# Patient Record
Sex: Male | Born: 1985 | Race: White | Hispanic: No | Smoking: Former smoker
Health system: Southern US, Community
[De-identification: ages and names within clinical notes are randomized; demographics above are authoritative.]

## PROBLEM LIST (undated history)

## (undated) DIAGNOSIS — F329 Major depressive disorder, single episode, unspecified: Secondary | ICD-10-CM

## (undated) DIAGNOSIS — F32A Depression, unspecified: Secondary | ICD-10-CM

## (undated) DIAGNOSIS — E7800 Pure hypercholesterolemia, unspecified: Secondary | ICD-10-CM

## (undated) DIAGNOSIS — L309 Dermatitis, unspecified: Secondary | ICD-10-CM

## (undated) DIAGNOSIS — N209 Urinary calculus, unspecified: Secondary | ICD-10-CM

## (undated) DIAGNOSIS — IMO0001 Reserved for inherently not codable concepts without codable children: Secondary | ICD-10-CM

## (undated) DIAGNOSIS — E785 Hyperlipidemia, unspecified: Secondary | ICD-10-CM

## (undated) DIAGNOSIS — G4700 Insomnia, unspecified: Secondary | ICD-10-CM

## (undated) DIAGNOSIS — J45909 Unspecified asthma, uncomplicated: Secondary | ICD-10-CM

## (undated) DIAGNOSIS — F419 Anxiety disorder, unspecified: Secondary | ICD-10-CM

## (undated) DIAGNOSIS — F640 Transsexualism: Secondary | ICD-10-CM

## (undated) DIAGNOSIS — E349 Endocrine disorder, unspecified: Secondary | ICD-10-CM

## (undated) HISTORY — DX: Anxiety disorder, unspecified: F41.9

## (undated) HISTORY — DX: Endocrine disorder, unspecified: E34.9

## (undated) HISTORY — DX: Urinary calculus, unspecified: N20.9

## (undated) HISTORY — DX: Major depressive disorder, single episode, unspecified: F32.9

## (undated) HISTORY — DX: Insomnia, unspecified: G47.00

## (undated) HISTORY — DX: Reserved for inherently not codable concepts without codable children: IMO0001

## (undated) HISTORY — DX: Hyperlipidemia, unspecified: E78.5

## (undated) HISTORY — DX: Depression, unspecified: F32.A

## (undated) HISTORY — DX: Dermatitis, unspecified: L30.9

## (undated) HISTORY — PX: NO PAST SURGERIES: SHX2092

## (undated) HISTORY — DX: Transsexualism: F64.0

---
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
## 2005-02-13 ENCOUNTER — Emergency Department (HOSPITAL_COMMUNITY): Admission: EM | Admit: 2005-02-13 | Discharge: 2005-02-13 | Payer: Self-pay | Admitting: Emergency Medicine

## 2005-04-17 ENCOUNTER — Ambulatory Visit: Payer: Self-pay | Admitting: Internal Medicine

## 2005-05-01 ENCOUNTER — Ambulatory Visit: Payer: Self-pay | Admitting: Gastroenterology

## 2005-05-04 ENCOUNTER — Ambulatory Visit (HOSPITAL_COMMUNITY): Admission: RE | Admit: 2005-05-04 | Discharge: 2005-05-04 | Payer: Self-pay | Admitting: Gastroenterology

## 2005-05-07 ENCOUNTER — Ambulatory Visit: Payer: Self-pay | Admitting: Internal Medicine

## 2005-05-09 ENCOUNTER — Ambulatory Visit: Payer: Self-pay | Admitting: Internal Medicine

## 2005-06-26 ENCOUNTER — Ambulatory Visit: Payer: Self-pay | Admitting: Internal Medicine

## 2005-10-31 ENCOUNTER — Ambulatory Visit: Payer: Self-pay | Admitting: Internal Medicine

## 2006-07-23 ENCOUNTER — Ambulatory Visit: Payer: Self-pay | Admitting: Internal Medicine

## 2006-11-08 ENCOUNTER — Ambulatory Visit: Payer: Self-pay | Admitting: Family Medicine

## 2007-05-02 DIAGNOSIS — J309 Allergic rhinitis, unspecified: Secondary | ICD-10-CM | POA: Insufficient documentation

## 2007-05-02 DIAGNOSIS — J45909 Unspecified asthma, uncomplicated: Secondary | ICD-10-CM

## 2007-05-06 ENCOUNTER — Ambulatory Visit: Payer: Self-pay | Admitting: Internal Medicine

## 2007-05-06 DIAGNOSIS — F411 Generalized anxiety disorder: Secondary | ICD-10-CM

## 2007-05-06 DIAGNOSIS — G4700 Insomnia, unspecified: Secondary | ICD-10-CM

## 2007-09-15 ENCOUNTER — Encounter (INDEPENDENT_AMBULATORY_CARE_PROVIDER_SITE_OTHER): Payer: Self-pay | Admitting: *Deleted

## 2007-10-16 ENCOUNTER — Telehealth (INDEPENDENT_AMBULATORY_CARE_PROVIDER_SITE_OTHER): Payer: Self-pay | Admitting: *Deleted

## 2007-10-29 ENCOUNTER — Encounter (INDEPENDENT_AMBULATORY_CARE_PROVIDER_SITE_OTHER): Payer: Self-pay | Admitting: *Deleted

## 2007-11-02 ENCOUNTER — Emergency Department (HOSPITAL_COMMUNITY): Admission: EM | Admit: 2007-11-02 | Discharge: 2007-11-02 | Payer: Self-pay | Admitting: Emergency Medicine

## 2007-11-03 ENCOUNTER — Ambulatory Visit: Payer: Self-pay | Admitting: Internal Medicine

## 2007-11-13 HISTORY — PX: INJECTION OF SILICONE OIL: SHX6422

## 2008-03-08 ENCOUNTER — Telehealth (INDEPENDENT_AMBULATORY_CARE_PROVIDER_SITE_OTHER): Payer: Self-pay | Admitting: *Deleted

## 2008-03-30 ENCOUNTER — Ambulatory Visit: Payer: Self-pay | Admitting: Internal Medicine

## 2008-09-14 ENCOUNTER — Telehealth (INDEPENDENT_AMBULATORY_CARE_PROVIDER_SITE_OTHER): Payer: Self-pay | Admitting: *Deleted

## 2008-09-16 ENCOUNTER — Telehealth (INDEPENDENT_AMBULATORY_CARE_PROVIDER_SITE_OTHER): Payer: Self-pay | Admitting: *Deleted

## 2008-12-08 ENCOUNTER — Ambulatory Visit: Payer: Self-pay | Admitting: Internal Medicine

## 2009-07-28 ENCOUNTER — Encounter (INDEPENDENT_AMBULATORY_CARE_PROVIDER_SITE_OTHER): Payer: Self-pay | Admitting: *Deleted

## 2009-08-22 ENCOUNTER — Ambulatory Visit: Payer: Self-pay | Admitting: Internal Medicine

## 2010-02-06 ENCOUNTER — Ambulatory Visit: Payer: Self-pay | Admitting: Internal Medicine

## 2010-02-06 ENCOUNTER — Telehealth (INDEPENDENT_AMBULATORY_CARE_PROVIDER_SITE_OTHER): Payer: Self-pay | Admitting: *Deleted

## 2010-08-23 ENCOUNTER — Telehealth (INDEPENDENT_AMBULATORY_CARE_PROVIDER_SITE_OTHER): Payer: Self-pay | Admitting: *Deleted

## 2010-09-05 ENCOUNTER — Encounter (INDEPENDENT_AMBULATORY_CARE_PROVIDER_SITE_OTHER): Payer: Self-pay | Admitting: *Deleted

## 2010-11-09 ENCOUNTER — Ambulatory Visit: Payer: Self-pay | Admitting: Internal Medicine

## 2010-11-14 ENCOUNTER — Other Ambulatory Visit: Payer: Self-pay | Admitting: Internal Medicine

## 2010-11-14 ENCOUNTER — Encounter: Payer: Self-pay | Admitting: Internal Medicine

## 2010-11-15 LAB — CBC WITH DIFFERENTIAL/PLATELET
Basophils Absolute: 0 10*3/uL (ref 0.0–0.1)
Basophils Relative: 0.4 % (ref 0.0–3.0)
Eosinophils Absolute: 0.2 10*3/uL (ref 0.0–0.7)
Eosinophils Relative: 3.2 % (ref 0.0–5.0)
HCT: 46 % (ref 39.0–52.0)
Hemoglobin: 15.6 g/dL (ref 13.0–17.0)
Lymphocytes Relative: 46.2 % — ABNORMAL HIGH (ref 12.0–46.0)
Lymphs Abs: 2.6 10*3/uL (ref 0.7–4.0)
MCHC: 34 g/dL (ref 30.0–36.0)
MCV: 88.4 fl (ref 78.0–100.0)
Monocytes Absolute: 0.6 10*3/uL (ref 0.1–1.0)
Monocytes Relative: 11.2 % (ref 3.0–12.0)
Neutro Abs: 2.2 10*3/uL (ref 1.4–7.7)
Neutrophils Relative %: 39 % — ABNORMAL LOW (ref 43.0–77.0)
Platelets: 222 10*3/uL (ref 150.0–400.0)
RBC: 5.2 Mil/uL (ref 4.22–5.81)
RDW: 13.8 % (ref 11.5–14.6)
WBC: 5.5 10*3/uL (ref 4.5–10.5)

## 2010-11-15 LAB — LIPID PANEL
Cholesterol: 279 mg/dL — ABNORMAL HIGH (ref 0–200)
HDL: 33.9 mg/dL — ABNORMAL LOW (ref >39.00)
Total CHOL/HDL Ratio: 8
Triglycerides: 281 mg/dL — ABNORMAL HIGH (ref 0.0–149.0)
VLDL: 56.2 mg/dL — ABNORMAL HIGH (ref 0.0–40.0)

## 2010-11-15 LAB — BASIC METABOLIC PANEL
BUN: 11 mg/dL (ref 6–23)
CO2: 27 mEq/L (ref 19–32)
Calcium: 9.7 mg/dL (ref 8.4–10.5)
Chloride: 102 mEq/L (ref 96–112)
Creatinine, Ser: 0.6 mg/dL (ref 0.4–1.5)
GFR: 161.13 mL/min (ref >60.00)
Glucose, Bld: 82 mg/dL (ref 70–99)
Potassium: 4.3 mEq/L (ref 3.5–5.1)
Sodium: 139 mEq/L (ref 135–145)

## 2010-11-15 LAB — HEPATIC FUNCTION PANEL
ALT: 21 U/L (ref 0–53)
AST: 21 U/L (ref 0–37)
Albumin: 4.5 g/dL (ref 3.5–5.2)
Alkaline Phosphatase: 68 U/L (ref 39–117)
Bilirubin, Direct: 0.1 mg/dL (ref 0.0–0.3)
Total Bilirubin: 1.1 mg/dL (ref 0.3–1.2)
Total Protein: 7.9 g/dL (ref 6.0–8.3)

## 2010-11-15 LAB — TSH: TSH: 1.9 u[IU]/mL (ref 0.35–5.50)

## 2010-11-15 LAB — LDL CHOLESTEROL, DIRECT: Direct LDL: 215.8 mg/dL

## 2010-11-16 ENCOUNTER — Telehealth: Payer: Self-pay | Admitting: Internal Medicine

## 2010-11-16 DIAGNOSIS — E785 Hyperlipidemia, unspecified: Secondary | ICD-10-CM | POA: Insufficient documentation

## 2010-11-16 HISTORY — DX: Hyperlipidemia, unspecified: E78.5

## 2010-11-22 ENCOUNTER — Encounter (INDEPENDENT_AMBULATORY_CARE_PROVIDER_SITE_OTHER): Payer: Self-pay | Admitting: *Deleted

## 2010-12-12 NOTE — Progress Notes (Signed)
Summary: appt---lmom  10/12, SENT LTR 10/25  Phone Note Outgoing Call   Call placed by: Army Fossa CMA,  August 23, 2010 10:32 AM Summary of Call: Pt is due for an OV with Dr.Paz.  Follow-up for Phone Call        lmom to call and schedule an OV.Jerolyn Shin  August 23, 2010 12:34 PM  MAILED LETTER.Marland KitchenMarland KitchenJerolyn Shin  September 05, 2010 10:06 AM

## 2010-12-12 NOTE — Progress Notes (Signed)
  Phone Note Call from Patient Call back at [phone]   Caller: mom Victorino Dike Reason for Call: Talk to Nurse Summary of Call: [place] [employer] [place], meds were in purse Zoloft and Xanax.  OV Scheduled Initial call taken by: Kandice Hams,  February 06, 2010 10:51 AM
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
## 2010-12-12 NOTE — Letter (Signed)
Summary: Primary Care Appointment Letter  [facility] [place]  [address] [place], [place] [address]   Phone: [phone]  Fax: [phone]    09/05/2010 MRN: [id number]  [facility] [address] [place], [place]  [address]  Dear Mr. Katrinka Blazing,   Your Primary Care Physician [place] E. Paz MD has indicated that:    __XXXX__it is time to schedule an appointment for an Office Visit with Dr Drue Novel.    _______you missed your appointment on______ and need to call and          reschedule.    _______you need to have lab work done.    _______you need to schedule an appointment discuss lab or test results.    _______you need to call to reschedule your appointment that is                       scheduled on _________.     Please call our office as soon as possible. Our phone number is [phone]. Our office is open 8a-12noon and 1p-5p, Monday through Friday.     Thank you,   Sarah at [facility] [address]   [facility]

## 2010-12-14 NOTE — Progress Notes (Signed)
Summary: lab results   Phone Note Outgoing Call   Summary of Call: advise patient: all labs wnl except for his cholesterol which is quite elevated  plan: nutritionist referal, needs daily exercise! please RTC 4 months fasting ----> we need to f/u on this Ethan E. Paz MD  November 16, 2010 8:47 PM   Follow-up for Phone Call        left message for pt to call back. Army Fossa CMA  November 17, 2010 9:30 AM   Additional Follow-up for Phone Call Additional follow up Details #1::        left message for pt to call back. Army Fossa CMA  November 20, 2010 2:28 PM   New Problems: HYPERLIPIDEMIA (ICD-272.4)   Additional Follow-up for Phone Call Additional follow up Details #2::    left message for pt to call back. Army Fossa CMA  November 21, 2010 9:06 AM  left message on voicemail to call back to office. Lucious Groves CMA  November 21, 2010 3:19 PM   please send a letter Elita Quick E. Paz MD  November 21, 2010 4:48 PM   Additional Follow-up for Phone Call Additional follow up Details #3:: Details for Additional Follow-up Action Taken: will send letter. Army Fossa CMA  November 22, 2010 4:19 PM   New Problems: HYPERLIPIDEMIA (ICD-272.4)

## 2010-12-14 NOTE — Letter (Signed)
Summary: Unable To Reach-Consult Scheduled  [facility] [place]  [address] [place], [place] [address]   Phone: [phone]  Fax: [phone]    11/22/2010 MRN: [id number]    Dear Ethan Harris,   We have been unable to reach you by phone.  Please contact our office with an updated phone number.  Enclosed is a copy of your lab results.     Thank you,   Army Fossa CMA  November 22, 2010 4:19 PM

## 2010-12-14 NOTE — Assessment & Plan Note (Signed)
Summary: CPX/FASTING/KN   Vital Signs:  Patient profile:   25 year old male Height:      65 inches Weight:      172.38 pounds BMI:     28.79 Pulse rate:   84 / minute Pulse rhythm:   regular BP sitting:   118 / 72  (left arm) Cuff size:   large  Vitals Entered By: Army Fossa CMA (November 14, 2010 2:06 PM) CC: CPX, fasting  Comments [facility]  declines Td today    History of Present Illness: CPX  Preventive Screening-Counseling & Management  Alcohol-Tobacco     Alcohol type: socially      Smoking Status: current     Packs/Day: socially   Caffeine-Diet-Exercise     Does Patient Exercise: no  Current Medications (verified): 1)  Zoloft 100 Mg Tabs (Sertraline Hcl) .... 2 By Mouth Once Daily 2)  Trazodone Hcl 50 Mg  Tabs (Trazodone Hcl) .Marland Kitchen.. 1 Tab Once Daily Due Office Visit 3)  Xanax 0.5 Mg  Tabs (Alprazolam) .... 1/2 By Mouth Qid  Allergies (verified): No Known Drug Allergies  Past History:  Past Medical History: INSOMNIA   ANXIETY   ASTHMA  ALLERGIC RHINITIS   Eczema Transgender (male to male)-- has used spironialtone, estrogen, premarin on-off   Past Surgical History: Reviewed history from 03/30/2008 and no changes required. none  Family History: Reviewed history from 11/03/2007 and no changes required. colon ca--no prostate ca--no DM--no MI--no  Social History: lives at his mother home (mon-- Jennifer [place]) has no boyfriend , denies been sex. active at present smoker-- socially  at some point used cocaine ETOH-- socially  diet-- planning to change  and improve   Smoking Status:  current Packs/Day:  socially  Does Patient Exercise:  no  Review of Systems General:  Denies fatigue and fever. CV:  Denies chest pain or discomfort and swelling of feet. Resp:  Denies cough and shortness of breath. GI:  Denies bloody stools, diarrhea, nausea, and vomiting. GU:  Denies dysuria and hematuria. Psych:  symptoms well controlled ( see  med list).  Physical Exam  General:  alert, well-developed, and slightly overweight-appearing.   Neck:  no masses and no thyromegaly.   Lungs:  normal respiratory effort, no intercostal retractions, no accessory muscle use, and normal breath sounds.   Heart:  normal rate, regular rhythm, and no murmur.   Abdomen:  soft, non-tender, no distention, no masses, no guarding, and no rigidity.   Extremities:  no pretibial edema bilaterally  Psych:  Oriented X3, memory intact for recent and remote, normally interactive, good eye contact, not anxious appearing, and not depressed appearing.     Impression & Recommendations:  Problem # 1:  ROUTINE GENERAL MEDICAL EXAM@HEALTH  CARE FACL (ICD-V70.0) td 2002   printed material provided regards diet, STE encouraged daily exercise and safe sex   RTC 1 year  labs   Orders: Venipuncture (16[address]) TLB-Lipid Panel (80061-LIPID) TLB-BMP (Basic Metabolic Panel-BMET) (80048-METABOL) TLB-CBC Platelet - w/Differential (85025-CBCD) TLB-Hepatic/Liver Function Pnl (80076-HEPATIC) TLB-TSH (Thyroid Stimulating Hormone) (84443-TSH) T-HIV Antibody  (Reflex) ([id number]) T-RPR (Syphilis) (91478-29562) Specimen Handling (13086)  Problem # 2:  ANXIETY (ICD-300.00) RF meds , RTC  1year His updated medication list for this problem includes:    Zoloft 100 Mg Tabs (Sertraline hcl) .Marland Kitchen... 2 by mouth once daily    Trazodone Hcl 50 Mg Tabs (Trazodone hcl) .Marland Kitchen... 1 tab once daily due office visit    Xanax 0.5 Mg Tabs (Alprazolam) .Marland Kitchen... 1/2  by mouth qid  Problem # 3:  INSOMNIA (ICD-780.52) RF RTC 1 year  Complete Medication List: 1)  Zoloft 100 Mg Tabs (Sertraline hcl) .... 2 by mouth once daily 2)  Trazodone Hcl 50 Mg Tabs (Trazodone hcl) .Marland Kitchen.. 1 tab once daily due office visit 3)  Xanax 0.5 Mg Tabs (Alprazolam) .... 1/2 by mouth qid  Patient Instructions: 1)  Please schedule a follow-up appointment in 1 year.  Prescriptions: XANAX 0.5 MG  TABS (ALPRAZOLAM)  1/2 by mouth qid  #60 x 3   Entered and Authorized by:   Nolon Rod. Ervie Mccard MD   Signed by:   Nolon Rod. Elivia Robotham MD on 11/14/2010   Method used:   Print then Give to Patient   RxID:   [id number] TRAZODONE HCL 50 MG  TABS (TRAZODONE HCL) 1 tab once daily due office visit  #30 Tablet x 12   Entered and Authorized by:   Nolon Rod. Crescentia Boutwell MD   Signed by:   Nolon Rod. Broly Hatfield MD on 11/14/2010   Method used:   Print then Give to Patient   RxID:   [id number] ZOLOFT 100 MG TABS (SERTRALINE HCL) 2 by mouth once daily  #60 Tablet x 12   Entered and Authorized by:   Nolon Rod. Fermina Mishkin MD   Signed by:   Nolon Rod. Josue Kass MD on 11/14/2010   Method used:   Print then Give to Patient   RxID:   [id number]    Orders Added: 1)  Venipuncture [53664] 2)  TLB-Lipid Panel [80061-LIPID] 3)  TLB-BMP (Basic Metabolic Panel-BMET) [80048-METABOL] 4)  TLB-CBC Platelet - w/Differential [85025-CBCD] 5)  TLB-Hepatic/Liver Function Pnl [80076-HEPATIC] 6)  TLB-TSH (Thyroid Stimulating Hormone) [84443-TSH] 7)  T-HIV Antibody  (Reflex) [[id number]] 8)  T-RPR (Syphilis) [63875-64332] 9)  Specimen Handling [99000] 10)  Est. Patient age 19-39 [10]     Risk Factors:  Tobacco use:  current    Type:  socially  Exercise:  no

## 2011-03-14 ENCOUNTER — Ambulatory Visit (INDEPENDENT_AMBULATORY_CARE_PROVIDER_SITE_OTHER): Payer: Self-pay | Admitting: Internal Medicine

## 2011-03-14 ENCOUNTER — Telehealth: Payer: Self-pay | Admitting: Internal Medicine

## 2011-03-14 ENCOUNTER — Encounter: Payer: Self-pay | Admitting: Internal Medicine

## 2011-03-14 DIAGNOSIS — F411 Generalized anxiety disorder: Secondary | ICD-10-CM

## 2011-03-14 DIAGNOSIS — E785 Hyperlipidemia, unspecified: Secondary | ICD-10-CM

## 2011-03-14 MED ORDER — ALPRAZOLAM 0.5 MG PO TABS: 0.5000 mg | ORAL_TABLET | Freq: Four times a day (QID) | ORAL | Status: DC | PRN

## 2011-03-14 NOTE — Assessment & Plan Note (Signed)
Continue trazodone. Okay to take the last Xanax of the day late at night.

## 2011-03-14 NOTE — Telephone Encounter (Signed)
Misty Stanley arrange a nutritionist referral. May need to ask the EPIC team, I don't know how to do it.

## 2011-03-14 NOTE — Progress Notes (Signed)
  Subjective:    Patient ID: Ethan Harris, male    DOB: Nov 12, 1986, 25 y.o.   MRN: [id number]  HPI Here with his sister and his mother. He is previously well control anxiety is not controlled any more for the last week. Very anxious Having frequent panic attacks Can't sleep well. Nothing has changed, denies any problems at work or at home. He did lose a friend 2 weeks ago.  Past Medical History  Diagnosis Date  . Insomnia   . Anxiety   . Allergic rhinitis   . Eczema   . 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   Social History: lives at his mother home (mon-- Ethan Harris) has no boyfriend , denies been sex. active at present smoker-- socially  at some point used cocaine ETOH-- socially      Review of Systems No fever or chills No disorder gross hematuria No nausea, vomiting, diarrhea Hospital a small amount of weight lately. Is trying to exercise more. Denies any suicidal ideas  or depression Denies taking any over-the-counter medications, herbal remedies, hormones.    Objective:   Physical Exam  Constitutional: He is oriented to person, place, and time. He appears well-developed and well-nourished.  Neck: No thyromegaly present.  Cardiovascular: Normal rate, regular rhythm and normal heart sounds.   No murmur heard. Pulmonary/Chest: Effort normal and breath sounds normal. No respiratory distress. He has no wheezes. He has no rales.  Musculoskeletal: He exhibits no edema.  Neurological: He is alert and oriented to person, place, and time.  Skin: Skin is warm and dry.  Psychiatric: He has a normal mood and affect. His behavior is normal. Thought content normal.          Assessment & Plan:  Today , I spent more than 25 min with the patient, >50% of the time counseling about diet, exercise and also anxiety

## 2011-03-14 NOTE — Assessment & Plan Note (Signed)
His cholesterol was extremely high few months ago, we did discuss his results. I recommend diet and exercise. We will refer him to a nutritionist. Reassess in a few months

## 2011-03-14 NOTE — Assessment & Plan Note (Addendum)
Long history of anxiety, has been well controlled for a while up until a week ago. Not clear why he is more anxious, it may be related to the recent loss of a friend. Several treatment options:Change Zoloft? ,Switch to clonazepam?, See a counselor?,  Increase Xanax? We agreed to increase Xanax from 2 tablets daily to 4 tablets a day. We are hoping the increase in anxiety will be temporarily (?related to the lost of his friend) and we agreed not to change Zoloft because it has worked very well for him for a long time.

## 2011-03-23 ENCOUNTER — Emergency Department (HOSPITAL_COMMUNITY)
Admission: EM | Admit: 2011-03-23 | Discharge: 2011-03-24 | Disposition: A | Discharge status: Home / Self Care | Payer: Self-pay | Attending: Emergency Medicine | Admitting: Emergency Medicine

## 2011-03-23 ENCOUNTER — Emergency Department (HOSPITAL_COMMUNITY): Payer: Self-pay

## 2011-03-23 DIAGNOSIS — R0602 Shortness of breath: Secondary | ICD-10-CM | POA: Insufficient documentation

## 2011-03-23 DIAGNOSIS — R0609 Other forms of dyspnea: Secondary | ICD-10-CM | POA: Insufficient documentation

## 2011-03-23 DIAGNOSIS — F411 Generalized anxiety disorder: Secondary | ICD-10-CM | POA: Insufficient documentation

## 2011-03-23 DIAGNOSIS — F410 Panic disorder [episodic paroxysmal anxiety] without agoraphobia: Secondary | ICD-10-CM | POA: Insufficient documentation

## 2011-03-23 DIAGNOSIS — R079 Chest pain, unspecified: Secondary | ICD-10-CM | POA: Insufficient documentation

## 2011-03-23 DIAGNOSIS — R0989 Other specified symptoms and signs involving the circulatory and respiratory systems: Secondary | ICD-10-CM | POA: Insufficient documentation

## 2011-03-23 LAB — POCT I-STAT, CHEM 8
Chloride: 103 mEq/L (ref 96–112)
Creatinine, Ser: 0.7 mg/dL (ref 0.4–1.5)
Glucose, Bld: 98 mg/dL (ref 70–99)
Sodium: 140 mEq/L (ref 135–145)
TCO2: 25 mmol/L (ref 0–100)

## 2011-03-24 LAB — POCT CARDIAC MARKERS

## 2011-03-24 LAB — D-DIMER, QUANTITATIVE: D-Dimer, Quant: <0.22 ug/mL-FEU (ref 0.00–0.48)

## 2011-04-02 ENCOUNTER — Encounter: Payer: Self-pay | Admitting: Internal Medicine

## 2011-04-02 ENCOUNTER — Ambulatory Visit (INDEPENDENT_AMBULATORY_CARE_PROVIDER_SITE_OTHER): Payer: Self-pay | Admitting: Internal Medicine

## 2011-04-02 DIAGNOSIS — F411 Generalized anxiety disorder: Secondary | ICD-10-CM

## 2011-04-02 NOTE — Progress Notes (Signed)
  Subjective:    Patient ID: Ethan Harris, male    DOB: 23-Jul-1986, 25 y.o.   MRN: [id number]  HPI Here with his sister, since the last time I saw him, he went to the ER with an ill-defined chest pain. Cannot describe if they pain anymore that pointing to the anterior chest. ER records reviewed, EKG/d-dimer /chest x-ray/labs were all normal. Since then he is feeling better  Past Medical History  Diagnosis Date  . Insomnia   . Anxiety   . Allergic rhinitis   . Eczema   . 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  Review of Systems Anxiety is currently well-controlled, the increase in Xanax has helped.     Objective:   Physical Exam Alert, oriented, in no apparent distress. No evidence of anxiety or depression during the interview       Assessment & Plan:  Today , I spent more than 15 min with the patient, >50% of the time counseling, and /or reviewing the chart and labs ordered by other providers

## 2011-04-02 NOTE — Assessment & Plan Note (Signed)
was seen @ the ER with chest pain, workup negative. In general he feels better since we increased the Xanax. Patient is counseled, I recommend  Him to see a counselor. At list of providers in this area gaved to the patient. No change in current therapy, followup in 6 months

## 2011-04-26 ENCOUNTER — Other Ambulatory Visit: Payer: Self-pay | Admitting: Internal Medicine

## 2011-04-26 NOTE — Telephone Encounter (Signed)
90, 1 RF 

## 2011-04-26 NOTE — Telephone Encounter (Signed)
Last refilled 03/14/11 #90

## 2011-06-22 ENCOUNTER — Other Ambulatory Visit: Payer: Self-pay | Admitting: Internal Medicine

## 2011-06-22 NOTE — Telephone Encounter (Signed)
Rx Done . 

## 2011-06-22 NOTE — Telephone Encounter (Signed)
Alprazolam request [last refill 04/26/11 #90x1] Please advise.

## 2011-06-22 NOTE — Telephone Encounter (Signed)
Ok 90, 1 RF 

## 2011-07-05 ENCOUNTER — Ambulatory Visit: Payer: Self-pay | Admitting: *Deleted

## 2011-07-30 ENCOUNTER — Other Ambulatory Visit: Payer: Self-pay | Admitting: Internal Medicine

## 2011-08-01 ENCOUNTER — Other Ambulatory Visit: Payer: Self-pay | Admitting: Internal Medicine

## 2011-08-02 NOTE — Telephone Encounter (Signed)
Ok 90 and 1 RF 

## 2011-08-02 NOTE — Telephone Encounter (Signed)
Faxed script back to [facility] @ [phone].Marland KitchenMarland Kitchen9/20/12@8 :36am/LMB

## 2011-10-02 ENCOUNTER — Ambulatory Visit (INDEPENDENT_AMBULATORY_CARE_PROVIDER_SITE_OTHER): Payer: Self-pay | Admitting: Internal Medicine

## 2011-10-02 ENCOUNTER — Encounter: Payer: Self-pay | Admitting: Internal Medicine

## 2011-10-02 VITALS — BP 120/80 | HR 70 | Temp 98.0°F | Wt 164.2 lb

## 2011-10-02 DIAGNOSIS — F411 Generalized anxiety disorder: Secondary | ICD-10-CM

## 2011-10-02 DIAGNOSIS — E785 Hyperlipidemia, unspecified: Secondary | ICD-10-CM

## 2011-10-02 DIAGNOSIS — G4700 Insomnia, unspecified: Secondary | ICD-10-CM

## 2011-10-02 DIAGNOSIS — J45909 Unspecified asthma, uncomplicated: Secondary | ICD-10-CM

## 2011-10-02 NOTE — Assessment & Plan Note (Signed)
Well controlled 

## 2011-10-02 NOTE — Assessment & Plan Note (Signed)
Declined flu shot

## 2011-10-02 NOTE — Progress Notes (Signed)
  Subjective:    Patient ID: Ethan Harris, male    DOB: 1986/09/22, 25 y.o.   MRN: [id number]  HPI ROV Anxiety well controlled insomnia well controlled, requiring less meds than before  Past Medical History  Diagnosis Date  . Insomnia   . Anxiety   . Allergic rhinitis   . Eczema   . 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 He appears well-developed and well-nourished.  HENT:  Head: Normocephalic and atraumatic.  Cardiovascular: Normal rate, regular rhythm and normal heart sounds.   No murmur heard. Pulmonary/Chest: Effort normal and breath sounds normal. No respiratory distress. He has no wheezes. He has no rales.  Neurological: He is alert and oriented to person, place, and time.  Psychiatric: He has a normal mood and affect. His behavior is normal. Judgment and thought content normal.       Assessment & Plan:

## 2011-10-02 NOTE — Assessment & Plan Note (Signed)
On life style modification, recheck on RTC

## 2011-10-02 NOTE — Assessment & Plan Note (Signed)
Well controlled RF as needed Having some family issues --> counseled

## 2011-10-02 NOTE — Patient Instructions (Signed)
Call for refills when needed

## 2011-10-09 ENCOUNTER — Other Ambulatory Visit: Payer: Self-pay | Admitting: Internal Medicine

## 2011-10-11 ENCOUNTER — Other Ambulatory Visit: Payer: Self-pay

## 2011-10-11 MED ORDER — ALPRAZOLAM 0.5 MG PO TABS: ORAL_TABLET | ORAL | Status: DC

## 2011-10-11 NOTE — Telephone Encounter (Signed)
Sister, Victorino Dike called to request refill of xanax for pt, stating that pt sent her a message via the computer because his phone is off. Rx [facility]

## 2011-10-11 NOTE — Telephone Encounter (Signed)
Last OV 10/02/11. Last filled 08/01/11

## 2012-01-02 ENCOUNTER — Encounter: Payer: Self-pay | Admitting: Internal Medicine

## 2012-01-03 ENCOUNTER — Other Ambulatory Visit: Payer: Self-pay | Admitting: Internal Medicine

## 2012-01-03 NOTE — Telephone Encounter (Signed)
Refill done.  

## 2012-01-04 NOTE — Telephone Encounter (Signed)
Refill done.  

## 2012-01-08 ENCOUNTER — Encounter: Payer: Self-pay | Admitting: Internal Medicine

## 2012-01-08 DIAGNOSIS — Z0289 Encounter for other administrative examinations: Secondary | ICD-10-CM

## 2012-03-06 ENCOUNTER — Other Ambulatory Visit: Payer: Self-pay | Admitting: Internal Medicine

## 2012-03-06 NOTE — Telephone Encounter (Signed)
Ok to refill 

## 2012-03-12 NOTE — Telephone Encounter (Signed)
has just took last day of meds, plese send refill requested 4.25.11 if possible

## 2012-03-12 NOTE — Telephone Encounter (Signed)
Ok to refill 

## 2012-03-12 NOTE — Telephone Encounter (Signed)
90, no RF OV before next RF

## 2012-03-12 NOTE — Telephone Encounter (Signed)
Refill done.  

## 2012-04-09 ENCOUNTER — Ambulatory Visit (INDEPENDENT_AMBULATORY_CARE_PROVIDER_SITE_OTHER): Payer: Self-pay | Admitting: Internal Medicine

## 2012-04-09 ENCOUNTER — Encounter: Payer: Self-pay | Admitting: Internal Medicine

## 2012-04-09 DIAGNOSIS — Z23 Encounter for immunization: Secondary | ICD-10-CM

## 2012-04-09 DIAGNOSIS — Z0000 Encounter for general adult medical examination without abnormal findings: Secondary | ICD-10-CM

## 2012-04-09 MED ORDER — ALPRAZOLAM 0.5 MG PO TABS: 0.5000 mg | ORAL_TABLET | Freq: Four times a day (QID) | ORAL | Status: DC | PRN

## 2012-04-09 MED ORDER — TRAZODONE HCL 50 MG PO TABS: 50.0000 mg | ORAL_TABLET | Freq: Every evening | ORAL | Status: DC | PRN

## 2012-04-09 NOTE — Assessment & Plan Note (Signed)
Td 2002 and today  counseled about exercise, diet, STE,  and safe sex  RTC 1 year labs  Chronic medical problems seem well-controlled, refill meds as needed

## 2012-04-09 NOTE — Progress Notes (Signed)
  Subjective:    Patient ID: Ethan Harris, male    DOB: 1986/04/09, 26 y.o.   MRN: [id number]  HPI CPX  Past Medical History  Diagnosis Date  . Insomnia   . Anxiety   . Allergic rhinitis   . Eczema   . 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 HYPERLIPIDEMIA 11/16/2010   Past Surgical History  Procedure Date  . No past surgeries    History   Social History  . Marital Status: Single    Spouse Name: N/A    Number of Children: N/A  . Years of Education: N/A   Occupational History  . entertainer     Social History Main Topics  . Smoking status: Former Smoker    Quit date: 04/13/2011  . Smokeless tobacco: Never Used  . Alcohol Use: Yes     socially  . Drug Use: No  . Sexually Active: Not on file   Other Topics Concern  . Not on file   Social History Narrative   Lives byhimself, (mom- Ronda Fairly being sex active at present---Diet, exercise: not doing well    Family History  Problem Relation Age of Onset  . Colon cancer Neg Hx   . Prostate cancer Neg Hx   . Diabetes Neg Hx   . Heart attack Neg Hx     Review of Systems No chest pain or shortness of breath No nausea, vomiting, diarrhea or blood in the stools No dysuria gross hematuria. Good compliance with all medications; insomnia, anxiety seem well-controlled. Currently not using any hormones. Denies being sexually active.    Objective:   Physical Exam General -- alert, well-developed, and overweight appearing. No apparent distress.  Neck --no  Thyromegaly  Lungs -- normal respiratory effort, no intercostal retractions, no accessory muscle use, and normal breath sounds.   Heart-- normal rate, regular rhythm, no murmur, and no gallop.   Abdomen--soft, non-tender, no distention, no masses, no HSM, no guarding, and no rigidity.   Extremities-- no pretibial edema bilaterally  Neurologic-- alert & oriented X3 and strength  normal in all extremities. Psych-- Cognition and judgment appear intact. Alert and cooperative with normal attention span and concentration.  not anxious appearing and not depressed appearing.       Assessment & Plan:

## 2012-04-10 LAB — CBC WITH DIFFERENTIAL/PLATELET
Basophils Relative: 0.6 % (ref 0.0–3.0)
Eosinophils Absolute: 0.3 10*3/uL (ref 0.0–0.7)
Eosinophils Relative: 4.7 % (ref 0.0–5.0)
HCT: 45.7 % (ref 39.0–52.0)
Lymphs Abs: 2.4 10*3/uL (ref 0.7–4.0)
MCHC: 33.2 g/dL (ref 30.0–36.0)
MCV: 90 fl (ref 78.0–100.0)
Platelets: 201 10*3/uL (ref 150.0–400.0)
RDW: 14.2 % (ref 11.5–14.6)
WBC: 5.8 10*3/uL (ref 4.5–10.5)

## 2012-04-10 LAB — LIPID PANEL
HDL: 46.4 mg/dL (ref >39.00)
Triglycerides: 214 mg/dL — ABNORMAL HIGH (ref 0.0–149.0)

## 2012-04-10 LAB — AST: AST: 35 U/L (ref 0–37)

## 2012-04-10 LAB — TSH: TSH: 2.06 u[IU]/mL (ref 0.35–5.50)

## 2012-04-14 ENCOUNTER — Encounter: Payer: Self-pay | Admitting: *Deleted

## 2012-05-21 ENCOUNTER — Encounter (HOSPITAL_BASED_OUTPATIENT_CLINIC_OR_DEPARTMENT_OTHER): Payer: Self-pay | Admitting: *Deleted

## 2012-05-21 ENCOUNTER — Emergency Department (HOSPITAL_BASED_OUTPATIENT_CLINIC_OR_DEPARTMENT_OTHER)
Admission: EM | Admit: 2012-05-21 | Discharge: 2012-05-21 | Disposition: A | Discharge status: Home / Self Care | Payer: Self-pay | Attending: Emergency Medicine | Admitting: Emergency Medicine

## 2012-05-21 ENCOUNTER — Emergency Department (HOSPITAL_BASED_OUTPATIENT_CLINIC_OR_DEPARTMENT_OTHER): Payer: Self-pay

## 2012-05-21 DIAGNOSIS — N201 Calculus of ureter: Secondary | ICD-10-CM | POA: Insufficient documentation

## 2012-05-21 DIAGNOSIS — R109 Unspecified abdominal pain: Secondary | ICD-10-CM | POA: Insufficient documentation

## 2012-05-21 DIAGNOSIS — F411 Generalized anxiety disorder: Secondary | ICD-10-CM | POA: Insufficient documentation

## 2012-05-21 MED ORDER — OXYCODONE-ACETAMINOPHEN 5-325 MG PO TABS: 1.0000 | ORAL_TABLET | ORAL | Status: AC | PRN

## 2012-05-21 MED ORDER — HYDROMORPHONE HCL PF 2 MG/ML IJ SOLN
2.0000 mg | Freq: Once | INTRAMUSCULAR | Status: AC
  Administered 2012-05-21: 2 mg via INTRAVENOUS
  Filled 2012-05-21: qty 1

## 2012-05-21 MED ORDER — ONDANSETRON HCL 4 MG/2ML IJ SOLN
4.0000 mg | Freq: Once | INTRAMUSCULAR | Status: AC
  Administered 2012-05-21: 4 mg via INTRAVENOUS
  Filled 2012-05-21: qty 2

## 2012-05-21 MED ORDER — TAMSULOSIN HCL 0.4 MG PO CAPS: 0.4000 mg | ORAL_CAPSULE | Freq: Every day | ORAL | Status: DC

## 2012-05-21 MED ORDER — OXYCODONE-ACETAMINOPHEN 5-325 MG PO TABS: 1.0000 | ORAL_TABLET | ORAL | Status: DC | PRN

## 2012-05-21 MED ORDER — SODIUM CHLORIDE 0.9 % IV SOLN
INTRAVENOUS | Status: DC
  Administered 2012-05-21: 09:00:00 via INTRAVENOUS

## 2012-05-21 MED ORDER — KETOROLAC TROMETHAMINE 30 MG/ML IJ SOLN
30.0000 mg | Freq: Once | INTRAMUSCULAR | Status: AC
  Administered 2012-05-21: 30 mg via INTRAVENOUS
  Filled 2012-05-21: qty 1

## 2012-05-21 NOTE — ED Provider Notes (Signed)
History     CSN: [id number]  Arrival date & time 05/21/12  1478   First MD Initiated Contact with Patient 05/21/12 [phone]      Chief Complaint  Patient presents with  . Abdominal Pain  . Flank Pain    (Consider location/radiation/quality/duration/timing/severity/associated sxs/prior treatment) HPI Comments: Patient is a who started having pain in the left flank and left lower quadrant about not half ago. The a kidney storly severe. He says like when he had a kidney stone several years ago. He called EMS and was brought to [facility] ED for evaluation.  Patient is a 26 y.o. male presenting with flank pain. The history is provided by the patient. No language interpreter was used.  Flank Pain This is a new problem. The current episode started 1 to 2 hours ago. The problem occurs constantly. The problem has not changed since onset.Pertinent negatives include no abdominal pain. Associated symptoms comments: No vomiting, no hematuria.. Nothing aggravates the symptoms. Nothing relieves the symptoms. He has tried nothing for the symptoms.    Past Medical History  Diagnosis Date  . Insomnia   . Anxiety   . Allergic rhinitis   . Eczema   . 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 HYPERLIPIDEMIA 11/16/2010    Past Surgical History  Procedure Date  . No past surgeries     Family History  Problem Relation Age of Onset  . Colon cancer Neg Hx   . Prostate cancer Neg Hx   . Diabetes Neg Hx   . Heart attack Neg Hx     History  Substance Use Topics  . Smoking status: Former Smoker    Quit date: 04/13/2011  . Smokeless tobacco: Never Used  . Alcohol Use: Yes     socially      Review of Systems  Constitutional: Negative.   HENT: Negative.   Eyes: Negative.   Respiratory: Negative.   Cardiovascular: Negative.   Gastrointestinal: Negative for nausea, vomiting, abdominal pain and diarrhea.    Genitourinary: Positive for flank pain. Negative for dysuria, frequency and hematuria.  Musculoskeletal: Negative.   Skin: Negative.   Neurological: Negative.   Psychiatric/Behavioral: The patient is nervous/anxious.     Allergies  Review of patient's allergies indicates no known allergies.  Home Medications   Current Outpatient Rx  Name Route Sig Dispense Refill  . ALPRAZOLAM 0.5 MG PO TABS Oral Take 1 tablet (0.5 mg total) by mouth 4 (four) times daily as needed for sleep. 90 tablet 4  . SERTRALINE HCL 100 MG PO TABS  take 2 tablets by mouth once daily 60 tablet 11  . TRAZODONE HCL 50 MG PO TABS Oral Take 1 tablet (50 mg total) by mouth at bedtime as needed for sleep. 30 tablet 4    BP 143/85  Pulse 80  Temp 98.1 F (36.7 C) (Oral)  Resp 16  SpO2 98%  Physical Exam  Nursing note and vitals reviewed. Constitutional: He is oriented to person, place, and time. He appears well-developed and well-nourished.       In moderate distress with left flank and left lower quadrant abdominal pain.  HENT:  Head: Normocephalic and atraumatic.  Right Ear: External ear normal.  Left Ear: External ear normal.  Mouth/Throat: Oropharynx is clear and moist.  Eyes: Conjunctivae and EOM are normal. Pupils are equal, round, and reactive to light.  Neck: Normal range of motion. Neck  supple.  Cardiovascular: Normal rate, regular rhythm and normal heart sounds.   Pulmonary/Chest: Effort normal and breath sounds normal.  Abdominal: Soft. Bowel sounds are normal.       He localizes pain to the left flank and left lower quadrant. There is no point tenderness and no mass. Bowel sounds are normal.  Musculoskeletal: Normal range of motion. He exhibits no edema and no tenderness.  Neurological: He is alert and oriented to person, place, and time.  Skin: Skin is warm and dry. Pallor: No sensory or motor deficit.  Psychiatric: He has a normal mood and affect. His behavior is normal.    ED Course   Procedures (including critical care time)   Labs Reviewed  URINALYSIS, ROUTINE W REFLEX MICROSCOPIC    8:12 AM Patient was seen and had physical examination. IV fluids, IV medications for pain and nausea were ordered. Urinalysis and CT of the abdomen without contrast were ordered.  8:52 AM CT of abdomen/pelvis shows a 5 mm stone at level of L2 in the left ureter.  Advised Flomax, Percocet.  Pt provided with a strainer to try to catch the stone.  Referred to Dr. Laverle Patter, urologist on call.  1. Left ureteral calculus            Carleene Cooper III, MD 05/21/12 [phone]

## 2012-05-21 NOTE — ED Notes (Signed)
Pt amb to room 1 with ems, smiling in nad. Pt reports sudden onset of llq pain radiating to left flank area. Rates at 8/10. Pt states this is similar to his kidney stone pain of the past.

## 2012-05-25 IMAGING — CR DG CHEST 2V
2 series · 2 of 2 positions shown · non-contrast
Comparison: None

CLINICAL DATA: Chest pain, pressure, shortness of breath, cough,
history of childhood asthma

CHEST - 2 VIEW

[w chest pa]
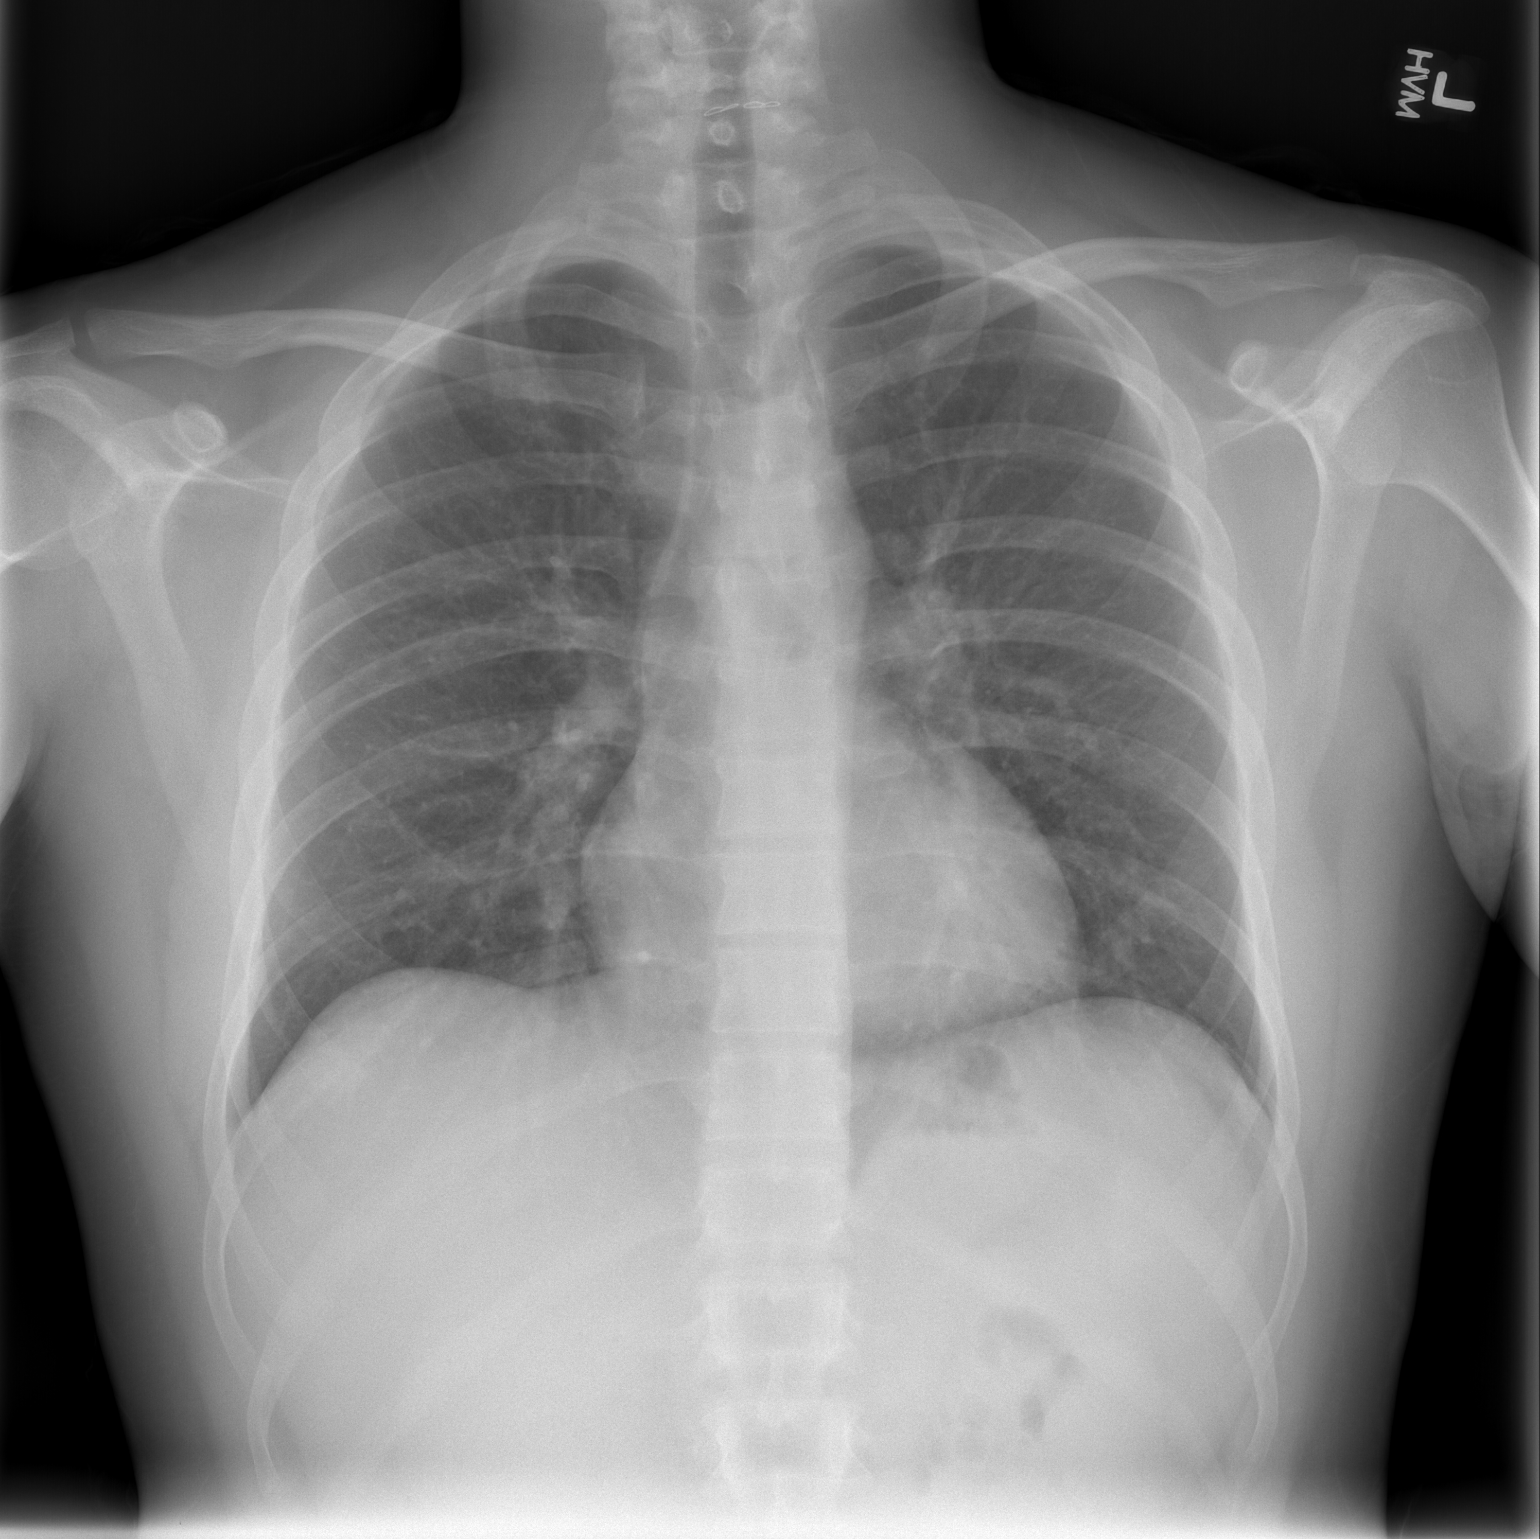

[w chest lat]
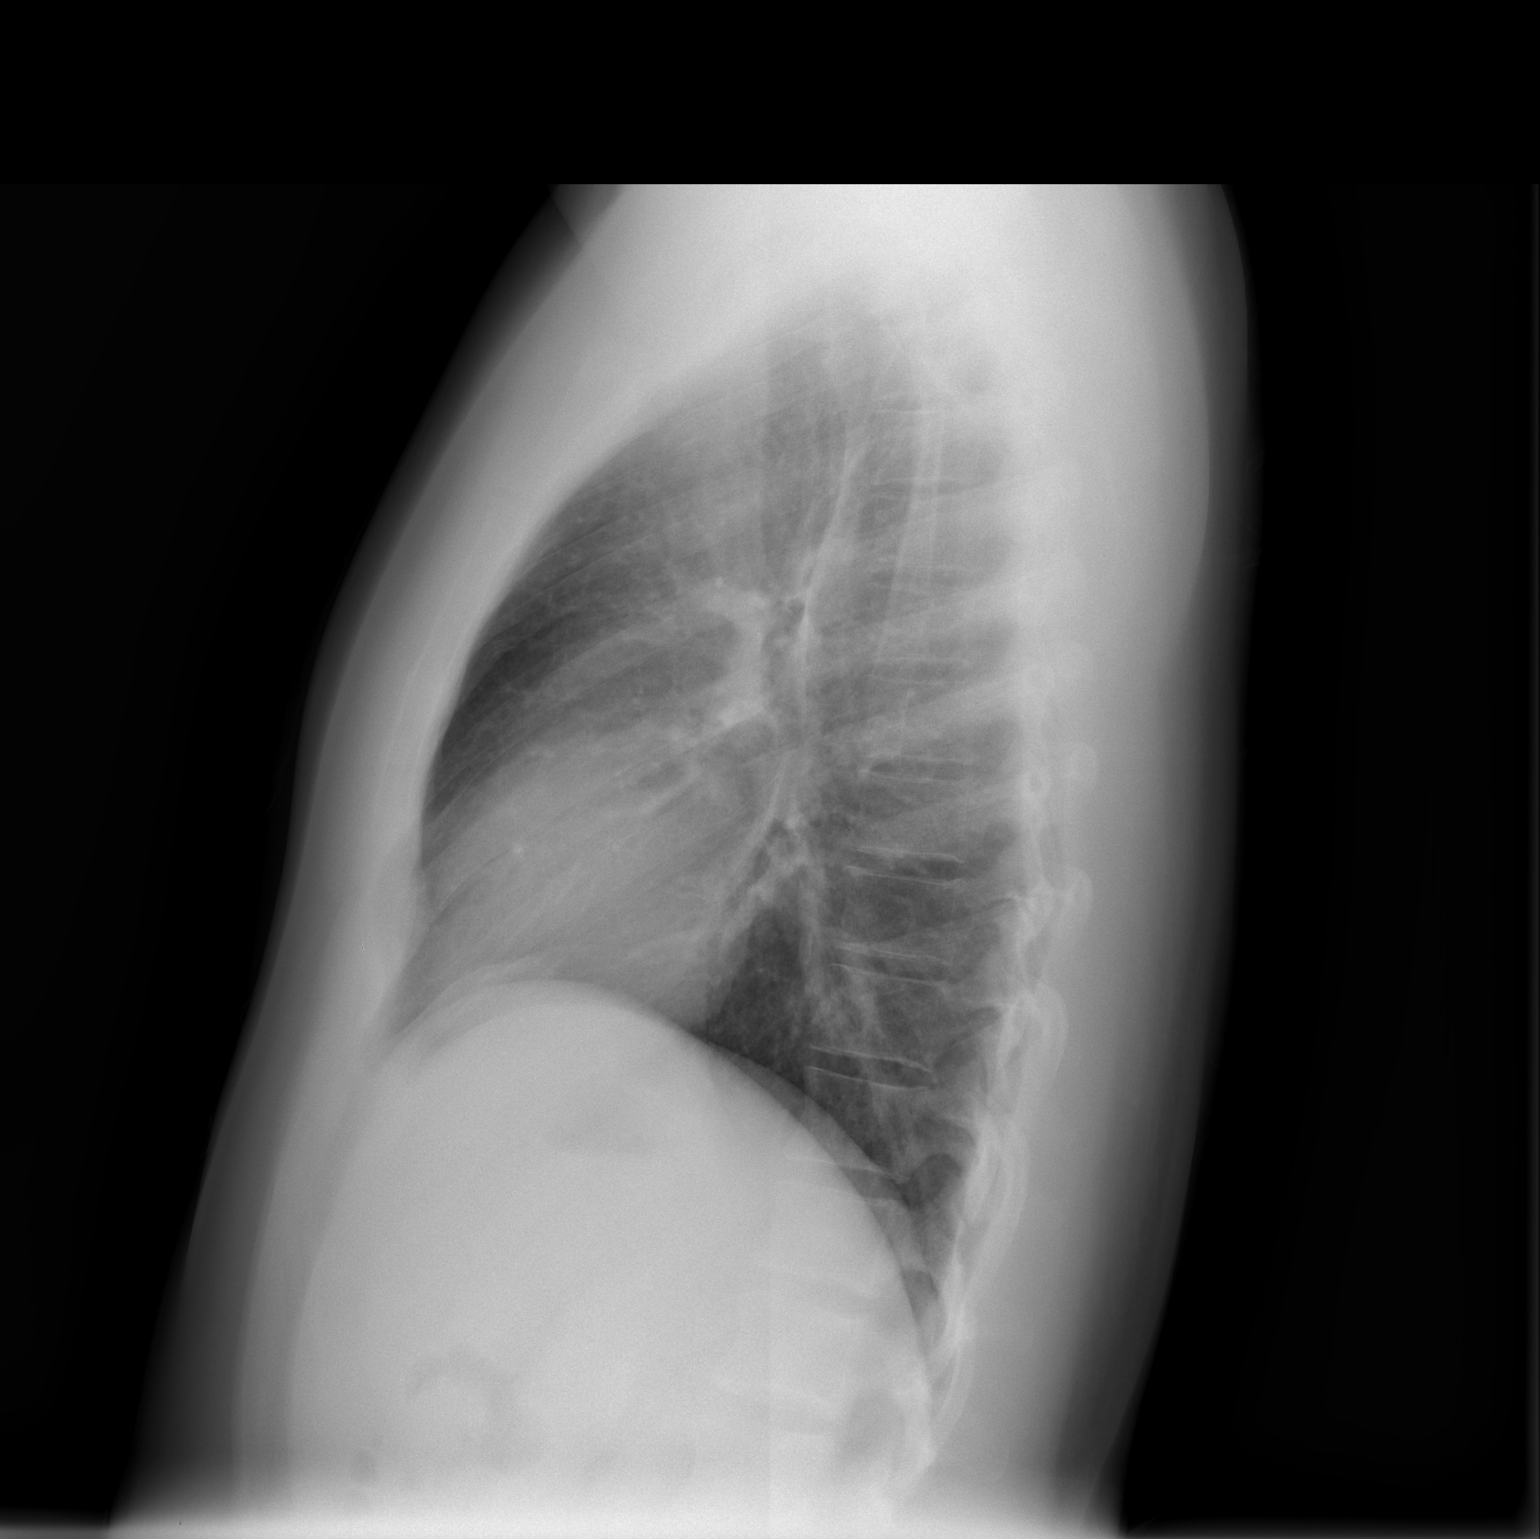

[2 of 2 positions shown; findings below may reference images not displayed]

FINDINGS: Normal heart size, mediastinal contours, and pulmonary vascularity.
Lungs clear.
No pleural effusion or pneumothorax.
Bones unremarkable.
IMPRESSION: No acute abnormalities.

## 2012-07-01 ENCOUNTER — Encounter (HOSPITAL_BASED_OUTPATIENT_CLINIC_OR_DEPARTMENT_OTHER): Payer: Self-pay | Admitting: Emergency Medicine

## 2012-07-01 ENCOUNTER — Emergency Department (HOSPITAL_BASED_OUTPATIENT_CLINIC_OR_DEPARTMENT_OTHER): Payer: Self-pay

## 2012-07-01 ENCOUNTER — Emergency Department (HOSPITAL_BASED_OUTPATIENT_CLINIC_OR_DEPARTMENT_OTHER)
Admission: EM | Admit: 2012-07-01 | Discharge: 2012-07-01 | Disposition: A | Discharge status: Home / Self Care | Payer: Self-pay | Attending: Emergency Medicine | Admitting: Emergency Medicine

## 2012-07-01 DIAGNOSIS — R109 Unspecified abdominal pain: Secondary | ICD-10-CM | POA: Insufficient documentation

## 2012-07-01 DIAGNOSIS — N201 Calculus of ureter: Secondary | ICD-10-CM | POA: Insufficient documentation

## 2012-07-01 DIAGNOSIS — N135 Crossing vessel and stricture of ureter without hydronephrosis: Secondary | ICD-10-CM

## 2012-07-01 LAB — URINALYSIS, ROUTINE W REFLEX MICROSCOPIC
Bilirubin Urine: NEGATIVE
Glucose, UA: NEGATIVE mg/dL
Specific Gravity, Urine: 1.024 (ref 1.005–1.030)

## 2012-07-01 LAB — URINE MICROSCOPIC-ADD ON

## 2012-07-01 MED ORDER — KETOROLAC TROMETHAMINE 15 MG/ML IJ SOLN
15.0000 mg | Freq: Once | INTRAMUSCULAR | Status: AC
  Administered 2012-07-01: 15 mg via INTRAVENOUS
  Filled 2012-07-01: qty 1

## 2012-07-01 MED ORDER — OXYCODONE-ACETAMINOPHEN 5-325 MG PO TABS: 1.0000 | ORAL_TABLET | ORAL | Status: AC | PRN

## 2012-07-01 MED ORDER — SODIUM CHLORIDE 0.9 % IV SOLN
INTRAVENOUS | Status: DC
  Administered 2012-07-01: 07:00:00 via INTRAVENOUS

## 2012-07-01 MED ORDER — CEPHALEXIN 500 MG PO CAPS: 500.0000 mg | ORAL_CAPSULE | Freq: Four times a day (QID) | ORAL | Status: AC

## 2012-07-01 MED ORDER — ONDANSETRON HCL 4 MG/2ML IJ SOLN
4.0000 mg | Freq: Once | INTRAMUSCULAR | Status: AC
  Administered 2012-07-01: 4 mg via INTRAVENOUS
  Filled 2012-07-01: qty 2

## 2012-07-01 MED ORDER — HYDROMORPHONE HCL PF 1 MG/ML IJ SOLN
1.0000 mg | Freq: Once | INTRAMUSCULAR | Status: AC
  Administered 2012-07-01: 1 mg via INTRAVENOUS
  Filled 2012-07-01: qty 1

## 2012-07-01 MED ORDER — ONDANSETRON 8 MG PO TBDP: 8.0000 mg | ORAL_TABLET | Freq: Three times a day (TID) | ORAL | Status: AC | PRN

## 2012-07-01 NOTE — ED Provider Notes (Signed)
Patient care assumed from Dr. Read Drivers.  Patient with pain consistent with kidney stone and CT reveals 6 mm distal left ureteral stone. Patient is currently pain-free. Patient advised of obstruction and need for close followup with urology. Patient is placed on Percocet, Zofran, and Keflex. Patient is referred to a lites urology and advised to call today for followup in the next 3-6 days.    Ct Abdomen Pelvis Wo Contrast  07/01/2012  *RADIOLOGY REPORT*  Clinical Data: Left flank pain radiating to the umbilical region and groin.  Hematuria.  CT ABDOMEN AND PELVIS WITHOUT CONTRAST  Technique:  Multidetector CT imaging of the abdomen and pelvis was performed following the standard protocol without intravenous contrast.  Comparison: 05/21/2012  Findings: The lung bases are clear.  6 mm calculus in the distal left ureter at the ureterovesicle junction.  Proximal pyelocaliectasis and ureterectasis.  Since  previous study, the stone has migrated from the ureteropelvic junction to the present location in the distal left ureter.  No right renal or ureteral stones.  The right renal collecting system is decompressed.  The bladder is decompressed and cannot be evaluated for wall thickness.  The unenhanced appearance of the liver, spleen, gallbladder, pancreas, adrenal glands, abdominal aorta, and retroperitoneal lymph nodes is unremarkable.  The stomach, small bowel, and colon are decompressed.  Small accessory spleen.  No free air or free fluid in the abdomen.  Pelvis:  Appendix is normal.  No diverticulitis.  Prostate gland is not enlarged.  No free or loculated pelvic fluid collections.  IMPRESSION: 6 mm stone in the distal left ureter with proximal ureteral obstruction.   Original Report Authenticated By: Marlon Pel, M.D.     Hilario Quarry, MD 07/01/12 [phone]

## 2012-07-01 NOTE — ED Provider Notes (Addendum)
History     CSN: [id number]  Arrival date & time 07/01/12  7829   None     Chief Complaint  Patient presents with  . Flank Pain    (Consider location/radiation/quality/duration/timing/severity/associated sxs/prior treatment) HPI This is a 26 year old male-to-male transgender patient with history of ureterolithiasis. He is here with several hours of left flank pain radiating to his left groin. The pain is like previous kidney stones. The pain is about a 7/10. He has not taken any medication for this. He has gross hematuria. He denies nausea or vomiting. He is afebrile.  Past Medical History  Diagnosis Date  . Insomnia   . Anxiety   . Allergic rhinitis   . Eczema   . 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 HYPERLIPIDEMIA 11/16/2010    Past Surgical History  Procedure Date  . No past surgeries     Family History  Problem Relation Age of Onset  . Colon cancer Neg Hx   . Prostate cancer Neg Hx   . Diabetes Neg Hx   . Heart attack Neg Hx     History  Substance Use Topics  . Smoking status: Former Smoker    Quit date: 04/13/2011  . Smokeless tobacco: Never Used  . Alcohol Use: Yes     socially      Review of Systems  All other systems reviewed and are negative.    Allergies  Review of patient's allergies indicates no known allergies.  Home Medications   Current Outpatient Rx  Name Route Sig Dispense Refill  . ALPRAZOLAM 0.5 MG PO TABS Oral Take 1 tablet (0.5 mg total) by mouth 4 (four) times daily as needed for sleep. 90 tablet 4  . SERTRALINE HCL 100 MG PO TABS  take 2 tablets by mouth once daily 60 tablet 11  . TAMSULOSIN HCL 0.4 MG PO CAPS Oral Take 1 capsule (0.4 mg total) by mouth daily. 5 capsule 0  . TRAZODONE HCL 50 MG PO TABS Oral Take 1 tablet (50 mg total) by mouth at bedtime as needed for sleep. 30 tablet 4    BP 144/85  Temp 98.1 F (36.7 C) (Oral)  Resp 20  Ht  5\' 5"  (1.651 m)  Wt 170 lb (77.111 kg)  BMI 28.29 kg/m2  SpO2 98%  Physical Exam General: Well-developed, well-nourished male in no acute distress; appearance consistent with age of record HENT: normocephalic, atraumatic Eyes: Normal appearance Neck: supple Heart: regular rate and rhythm Lungs: clear to auscultation bilaterally Abdomen: soft; nondistended; nontender; bowel sounds present GU: no flank tenderness; urine grossly bloody Extremities: No deformity; full range of motion Neurologic: Awake, alert and oriented; motor function intact in all extremities and symmetric; no facial droop Skin: Warm and dry     ED Course  Procedures (including critical care time)    MDM   Results for orders placed during the hospital encounter of 07/01/12  URINALYSIS, ROUTINE W REFLEX MICROSCOPIC      Component Value Range   Color, Urine RED (*) YELLOW   APPearance CLOUDY (*) CLEAR   Specific Gravity, Urine 1.024  1.005 - 1.030   pH 6.0  5.0 - 8.0   Glucose, UA NEGATIVE  NEGATIVE mg/dL   Hgb urine dipstick LARGE (*) NEGATIVE   Bilirubin Urine NEGATIVE  NEGATIVE   Ketones, ur NEGATIVE  NEGATIVE mg/dL   Protein, ur 30 (*) NEGATIVE mg/dL   Urobilinogen, UA 1.0  0.0 - 1.0 mg/dL   Nitrite NEGATIVE  NEGATIVE   Leukocytes, UA SMALL (*) NEGATIVE  URINE MICROSCOPIC-ADD ON      Component Value Range   Squamous Epithelial / LPF RARE  RARE   WBC, UA 0-2  <3 WBC/hpf   RBC / HPF TOO NUMEROUS TO COUNT  <3 RBC/hpf   Bacteria, UA RARE  RARE     Dr. Rosalia Hammers will follow lab CT results and make disposition.         Hanley Seamen, MD 07/01/12 1610  Hanley Seamen, MD 07/01/12 [id number]  Hanley Seamen, MD 07/02/12 [phone]

## 2012-07-01 NOTE — ED Notes (Signed)
Pt awoke with left sided flank pain, that radiates to pelvis and back

## 2012-10-03 ENCOUNTER — Other Ambulatory Visit: Payer: Self-pay | Admitting: Internal Medicine

## 2012-10-03 NOTE — Telephone Encounter (Signed)
Ok to refill 

## 2012-10-03 NOTE — Telephone Encounter (Signed)
done

## 2012-11-26 ENCOUNTER — Emergency Department (HOSPITAL_COMMUNITY)
Admission: EM | Admit: 2012-11-26 | Discharge: 2012-11-26 | Disposition: A | Discharge status: Home / Self Care | Payer: Self-pay | Attending: Emergency Medicine | Admitting: Emergency Medicine

## 2012-11-26 ENCOUNTER — Encounter (HOSPITAL_COMMUNITY): Payer: Self-pay | Admitting: Emergency Medicine

## 2012-11-26 DIAGNOSIS — Z79899 Other long term (current) drug therapy: Secondary | ICD-10-CM | POA: Insufficient documentation

## 2012-11-26 DIAGNOSIS — Y929 Unspecified place or not applicable: Secondary | ICD-10-CM | POA: Insufficient documentation

## 2012-11-26 DIAGNOSIS — T18108A Unspecified foreign body in esophagus causing other injury, initial encounter: Secondary | ICD-10-CM | POA: Insufficient documentation

## 2012-11-26 DIAGNOSIS — E785 Hyperlipidemia, unspecified: Secondary | ICD-10-CM | POA: Insufficient documentation

## 2012-11-26 DIAGNOSIS — F411 Generalized anxiety disorder: Secondary | ICD-10-CM | POA: Insufficient documentation

## 2012-11-26 DIAGNOSIS — IMO0002 Reserved for concepts with insufficient information to code with codable children: Secondary | ICD-10-CM | POA: Insufficient documentation

## 2012-11-26 DIAGNOSIS — T18128A Food in esophagus causing other injury, initial encounter: Secondary | ICD-10-CM

## 2012-11-26 DIAGNOSIS — G4700 Insomnia, unspecified: Secondary | ICD-10-CM | POA: Insufficient documentation

## 2012-11-26 DIAGNOSIS — Y9389 Activity, other specified: Secondary | ICD-10-CM | POA: Insufficient documentation

## 2012-11-26 DIAGNOSIS — Z87891 Personal history of nicotine dependence: Secondary | ICD-10-CM | POA: Insufficient documentation

## 2012-11-26 DIAGNOSIS — Z872 Personal history of diseases of the skin and subcutaneous tissue: Secondary | ICD-10-CM | POA: Insufficient documentation

## 2012-11-26 MED ORDER — GLUCAGON HCL (RDNA) 1 MG IJ SOLR
1.0000 mg | Freq: Once | INTRAMUSCULAR | Status: AC
  Administered 2012-11-26: 1 mg via INTRAVENOUS
  Filled 2012-11-26: qty 1

## 2012-11-26 NOTE — Discharge Instructions (Signed)
Take Omeprazole (Prilosec OTC) once a day. Make sure to chew your food thoroughly before swallowing.  Swallowed Foreign Body, Adult You have swallowed an object (foreign body). Once the foreign body has passed through the food tube (esophagus), which leads from the mouth to the stomach, it will usually continue through the body without problems. This is because the point where the esophagus enters into the stomach is the narrowest place through which the foreign body must pass. Sometimes the foreign body gets stuck. The most common type of foreign body obstruction in adults is food impaction. Many times, bones from fish or meat products may become lodged in the esophagus or injure the throat on the way down. When there is an object that obstructs the esophagus, the most obvious symptoms are pain and the inability to swallow normally. In some cases, foreign bodies that can be life threatening are swallowed. Examples of these are certain medications and illicit drugs. Often in these instances, patients are afraid of telling what they swallowed. However, it is extremely important to tell the emergency caregiver what was swallowed because life-saving treatment may be needed.  X-ray exams may be taken to find the location of the foreign body. However, some objects do not show up well or may be too small to be seen on an X-ray image. If the foreign body is too large or too sharp, it may be too dangerous to allow it to pass on its own. You may need to see a caregiver who specializes in the digestive system (gastroenterologist). In a few cases, a specialist may need to remove the object using a method called "endoscopy". This involves passing a thin, soft, flexible tube into the food pipe to locate and remove the object. Follow up with your primary doctor or the referral you were given by the emergency caregiver. HOME CARE INSTRUCTIONS   If your caregiver says it is safe for you to eat, then only have liquids and  soft foods until your symptoms improve.  Once you are eating normally:  Cut food into small pieces.  Remove small bones from food.  Remove large seeds and pits from fruit.  Chew your food well.  Do not talk, laugh, or engage in physical activity while eating or swallowing. SEEK MEDICAL CARE IF:  You develop worsening shortness of breath, uncontrollable coughing, chest pains or high fever, greater than 102 F (38.9 C).  You are unable to eat or drink or you feel that food is getting stuck in your throat.  You have choking symptoms or cannot stop drooling.  You develop abdominal pain, vomiting (especially of blood), or rectal bleeding. MAKE SURE YOU:   Understand these instructions.  Will watch your condition.  Will get help right away if you are not doing well or get worse. Document Released: 04/18/2010 Document Revised: 01/21/2012 Document Reviewed: 04/18/2010 [facility] Patient Information 2013 [place], [place].

## 2012-11-26 NOTE — ED Notes (Signed)
FYI: PT IDENTIFIES AS A MALE.  Pt states that he has a hx of food getting lodged in his throat.  States he has had to have barium swallows done before.  Pt states that about 30 minutes ago, and states that a piece of chicken did not go down all the way and is "sitting in his esophagus".

## 2012-11-26 NOTE — ED Provider Notes (Signed)
History     CSN: [id number]  Arrival date & time 11/26/12  1547   First MD Initiated Contact with Patient 11/26/12 1613      Chief Complaint  Patient presents with  . "piece of food stuck in esophagus"     (Consider location/radiation/quality/duration/timing/severity/associated sxs/prior treatment) The history is provided by the patient.   27 year old male was eating chicken about 45 minutes ago when a piece got stuck in his esophagus. Since then, has not been able to swallow anything. He tried drinking some water and it came right back up. He denies any pain or difficulty breathing. He had a similar episode about 15 years ago but that he never did have an endoscopy done. Nothing makes this better nothing makes it worse. He does admit to symptoms of GERD with frequent heartburn which he treats with TUMS. He has not been on any proton pump inhibitors or H2 blockers.  Past Medical History  Diagnosis Date  . Insomnia   . Anxiety   . Allergic rhinitis   . Eczema   . 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 HYPERLIPIDEMIA 11/16/2010    Past Surgical History  Procedure Date  . No past surgeries     Family History  Problem Relation Age of Onset  . Colon cancer Neg Hx   . Prostate cancer Neg Hx   . Diabetes Neg Hx   . Heart attack Neg Hx     History  Substance Use Topics  . Smoking status: Former Smoker    Quit date: 04/13/2011  . Smokeless tobacco: Never Used  . Alcohol Use: Yes     Comment: socially      Review of Systems  All other systems reviewed and are negative.    Allergies  Review of patient's allergies indicates no known allergies.  Home Medications   Current Outpatient Rx  Name  Route  Sig  Dispense  Refill  . ALPRAZOLAM 0.5 MG PO TABS   Oral   Take 0.5 mg by mouth 4 (four) times daily as needed. For anxiety.         . SERTRALINE HCL 100 MG PO TABS   Oral   Take 200 mg  by mouth daily.         . TRAZODONE HCL 50 MG PO TABS   Oral   Take 1 tablet (50 mg total) by mouth at bedtime as needed for sleep.   30 tablet   4     BP 152/88  Pulse 78  Temp 98.2 F (36.8 C) (Oral)  Resp 18  SpO2 100%  Physical Exam  Nursing note and vitals reviewed.  27 year old male, resting comfortably and in no acute distress. Vital signs are significant for hypertension with blood pressure 152/88. Oxygen saturation is 100%, which is normal. Head is normocephalic and atraumatic. PERRLA, EOMI. Oropharynx is clear. Neck is nontender and supple without adenopathy or JVD. Back is nontender and there is no CVA tenderness. Lungs are clear without rales, wheezes, or rhonchi. Chest is nontender. Heart has regular rate and rhythm without murmur. Abdomen is soft, flat, nontender without masses or hepatosplenomegaly and peristalsis is normoactive. Extremities have no cyanosis or edema, full range of motion is present. Skin is warm and dry without rash. Neurologic: Mental status is normal, cranial nerves are intact, there are no motor or sensory deficits.  ED Course  Procedures (including critical care time)  1. Food impaction of esophagus       MDM  Esophageal meat impaction. Given his history of GERD, esophageal strictures the most likely cause. However, given that he had an episode when he he was in his early teens, consider anatomic lesion such as esophageal web. He'll be given a dose of glucagon and if that fails, he will need to have an endoscopy. He probably should have elective endoscopy even if that is successful to evaluate for possible GERD and stricture, and therapeutic esophageal dilatation.  There is complete relief of symptoms with IV glucagon. Following this, patient was able to drink water without any difficulty. He is referred to gastroenterology for followup. He is advised to take Prilosec OTC once a day until evaluated by  gastroenterology.        Dione Booze, MD 11/26/12 [phone]

## 2013-01-05 ENCOUNTER — Other Ambulatory Visit: Payer: Self-pay | Admitting: Internal Medicine

## 2013-01-05 NOTE — Telephone Encounter (Signed)
Refill done.  

## 2013-01-30 ENCOUNTER — Telehealth: Payer: Self-pay | Admitting: Internal Medicine

## 2013-01-30 NOTE — Telephone Encounter (Signed)
Done

## 2013-01-30 NOTE — Telephone Encounter (Signed)
Ok to refill? Last OV 5.29.13

## 2013-02-09 DIAGNOSIS — Z862 Personal history of diseases of the blood and blood-forming organs and certain disorders involving the immune mechanism: Secondary | ICD-10-CM | POA: Insufficient documentation

## 2013-02-09 DIAGNOSIS — Z872 Personal history of diseases of the skin and subcutaneous tissue: Secondary | ICD-10-CM | POA: Insufficient documentation

## 2013-02-09 DIAGNOSIS — Z79899 Other long term (current) drug therapy: Secondary | ICD-10-CM | POA: Insufficient documentation

## 2013-02-09 DIAGNOSIS — F3289 Other specified depressive episodes: Secondary | ICD-10-CM | POA: Insufficient documentation

## 2013-02-09 DIAGNOSIS — Z8709 Personal history of other diseases of the respiratory system: Secondary | ICD-10-CM | POA: Insufficient documentation

## 2013-02-09 DIAGNOSIS — F329 Major depressive disorder, single episode, unspecified: Secondary | ICD-10-CM | POA: Insufficient documentation

## 2013-02-09 DIAGNOSIS — R509 Fever, unspecified: Secondary | ICD-10-CM | POA: Insufficient documentation

## 2013-02-09 DIAGNOSIS — Z8639 Personal history of other endocrine, nutritional and metabolic disease: Secondary | ICD-10-CM | POA: Insufficient documentation

## 2013-02-09 DIAGNOSIS — J029 Acute pharyngitis, unspecified: Secondary | ICD-10-CM | POA: Insufficient documentation

## 2013-02-09 DIAGNOSIS — Z87891 Personal history of nicotine dependence: Secondary | ICD-10-CM | POA: Insufficient documentation

## 2013-02-09 DIAGNOSIS — F411 Generalized anxiety disorder: Secondary | ICD-10-CM | POA: Insufficient documentation

## 2013-02-10 ENCOUNTER — Encounter (HOSPITAL_COMMUNITY): Payer: Self-pay | Admitting: Family Medicine

## 2013-02-10 ENCOUNTER — Emergency Department (HOSPITAL_COMMUNITY)
Admission: EM | Admit: 2013-02-10 | Discharge: 2013-02-10 | Disposition: A | Discharge status: Home / Self Care | Payer: Self-pay | Attending: Emergency Medicine | Admitting: Emergency Medicine

## 2013-02-10 DIAGNOSIS — J029 Acute pharyngitis, unspecified: Secondary | ICD-10-CM

## 2013-02-10 HISTORY — DX: Pure hypercholesterolemia, unspecified: E78.00

## 2013-02-10 MED ORDER — IBUPROFEN 200 MG PO TABS
600.0000 mg | ORAL_TABLET | Freq: Once | ORAL | Status: AC
  Administered 2013-02-10: 600 mg via ORAL
  Filled 2013-02-10: qty 3

## 2013-02-10 MED ORDER — IBUPROFEN 600 MG PO TABS: 600.0000 mg | ORAL_TABLET | Freq: Three times a day (TID) | ORAL | Status: DC | PRN

## 2013-02-10 MED ORDER — PENICILLIN V POTASSIUM 500 MG PO TABS: 500.0000 mg | ORAL_TABLET | Freq: Four times a day (QID) | ORAL | Status: AC

## 2013-02-10 MED ORDER — HYDROCODONE-ACETAMINOPHEN 5-325 MG PO TABS
1.0000 | ORAL_TABLET | Freq: Once | ORAL | Status: AC
  Administered 2013-02-10: 1 via ORAL
  Filled 2013-02-10: qty 1

## 2013-02-10 NOTE — ED Notes (Signed)
Patient states that he has had a sore throat, low grade fever and swollen glands over the past few days. Recently had strep throat and did complete antibiotic course. Reports left side throat pain with pain with swallowing.  Erythema noted to throat.

## 2013-02-10 NOTE — ED Notes (Signed)
Dr. Campos at bedside   

## 2013-02-10 NOTE — ED Provider Notes (Signed)
History     CSN: [id number]  Arrival date & time 02/09/13  2318   None     Chief Complaint  Patient presents with  . Sore Throat    (Consider location/radiation/quality/duration/timing/severity/associated sxs/prior treatment) The history is provided by the patient.   the patient reports worsening sore throat over the past one week.  He was on a short course antibiotics but this didn't seem to help.  He now reports he feels like the pain is slightly more on his left side.  He has a lymph node also on his left side.  He has pain with swallowing.  He's continued to be a lead and drink.  He tried over-the-counter pain medications with some improvement in his symptoms.  Symptoms are mild to moderate in severity.  Nothing improves his pain.  He does not remember the name of the antibiotic Past Medical History  Diagnosis Date  . Insomnia   . Anxiety   . Allergic rhinitis   . Eczema   . 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 HYPERLIPIDEMIA 11/16/2010  . Depression   . Hypercholesteremia     Past Surgical History  Procedure Laterality Date  . No past surgeries      Family History  Problem Relation Age of Onset  . Colon cancer Neg Hx   . Prostate cancer Neg Hx   . Diabetes Neg Hx   . Heart attack Neg Hx     History  Substance Use Topics  . Smoking status: Former Smoker    Quit date: 04/13/2011  . Smokeless tobacco: Never Used  . Alcohol Use: Yes     Comment: Socially      Review of Systems  All other systems reviewed and are negative.    Allergies  Review of patient's allergies indicates no known allergies.  Home Medications   Current Outpatient Rx  Name  Route  Sig  Dispense  Refill  . ALPRAZolam (XANAX) 0.5 MG tablet      take 1 tablet by mouth four times a day if needed   90 tablet   2   . sertraline (ZOLOFT) 100 MG tablet   Oral   Take 200 mg by mouth daily.         Marland Kitchen  ibuprofen (ADVIL,MOTRIN) 600 MG tablet   Oral   Take 1 tablet (600 mg total) by mouth every 8 (eight) hours as needed for pain.   15 tablet   0   . penicillin v potassium (VEETID) 500 MG tablet   Oral   Take 1 tablet (500 mg total) by mouth 4 (four) times daily.   28 tablet   0   . traZODone (DESYREL) 50 MG tablet   Oral   Take 1 tablet (50 mg total) by mouth at bedtime as needed for sleep.   30 tablet   4     BP 143/87  Pulse 83  Temp(Src) 98.6 F (37 C) (Oral)  Resp 20  Wt 180 lb 4 oz (81.761 kg)  BMI 30 kg/m2  SpO2 100%  Physical Exam  Nursing note and vitals reviewed. Constitutional: He is oriented to person, place, and time. He appears well-developed and well-nourished.  HENT:  Head: Normocephalic and atraumatic.  Uvula is midline.  Tolerating secretions.  Mild posterior pharyngeal erythema left greater than right.  No peritonsillar abscess.  Normal dentition.  Eyes: EOM are normal.  Neck: Normal range  of motion.  Small cervical lymphadenopathy on the left.   Cardiovascular: Normal rate, regular rhythm, normal heart sounds and intact distal pulses.   Pulmonary/Chest: Effort normal and breath sounds normal. No respiratory distress.  Abdominal: Soft. He exhibits no distension. There is no tenderness.  Musculoskeletal: Normal range of motion.  Neurological: He is alert and oriented to person, place, and time.  Skin: Skin is warm and dry.  Psychiatric: He has a normal mood and affect. Judgment normal.    ED Course  Procedures (including critical care time)  Labs Reviewed - No data to display No results found.   1. Pharyngitis       MDM  Discharge him with treatment for pharyngitis.        Lyanne Co, MD 02/10/13 [phone]

## 2013-02-10 NOTE — ED Notes (Signed)
Patient reports low fever and sore throat past few days. Patient states he completed antibiotics recently but does not feel better.

## 2013-05-29 ENCOUNTER — Other Ambulatory Visit: Payer: Self-pay | Admitting: Internal Medicine

## 2013-05-29 ENCOUNTER — Telehealth: Payer: Self-pay | Admitting: Internal Medicine

## 2013-05-29 NOTE — Telephone Encounter (Signed)
Refill already completed.

## 2013-05-29 NOTE — Telephone Encounter (Signed)
Ok to refill? Last OV 5.29.13, pt does have a future appt scheduled 8.21.14 Last filled 3.21.14

## 2013-05-29 NOTE — Telephone Encounter (Signed)
Refill done #90 no refills per verbal order Dr. Drue Novel and faxed to pharmacy.

## 2013-05-29 NOTE — Telephone Encounter (Signed)
Ok to refill?  Last OV 5.29.13 Last filled 3.21.14 for #90 with 2 RF No controlled substance contract found

## 2013-05-29 NOTE — Telephone Encounter (Signed)
Patient is almost out of his Xanax medication and is calling to request a refill.

## 2013-05-29 NOTE — Telephone Encounter (Signed)
Ok 30 day supply, no further RF w/o OV

## 2013-05-29 NOTE — Telephone Encounter (Addendum)
Has an appointment next months, ok 30 day supply

## 2013-07-01 ENCOUNTER — Encounter: Payer: Self-pay | Admitting: Lab

## 2013-07-02 ENCOUNTER — Encounter: Payer: Self-pay | Admitting: Internal Medicine

## 2013-07-02 DIAGNOSIS — Z0289 Encounter for other administrative examinations: Secondary | ICD-10-CM

## 2013-07-13 ENCOUNTER — Other Ambulatory Visit: Payer: Self-pay | Admitting: Internal Medicine

## 2013-07-14 NOTE — Telephone Encounter (Signed)
DNKA  Needs OV

## 2013-07-14 NOTE — Telephone Encounter (Signed)
Ok to refill? Last OV 5.29.13 Last filled 7.18.14

## 2013-07-20 ENCOUNTER — Emergency Department (HOSPITAL_BASED_OUTPATIENT_CLINIC_OR_DEPARTMENT_OTHER): Payer: Self-pay

## 2013-07-20 ENCOUNTER — Encounter (HOSPITAL_BASED_OUTPATIENT_CLINIC_OR_DEPARTMENT_OTHER): Payer: Self-pay | Admitting: Family Medicine

## 2013-07-20 ENCOUNTER — Emergency Department (HOSPITAL_BASED_OUTPATIENT_CLINIC_OR_DEPARTMENT_OTHER)
Admission: EM | Admit: 2013-07-20 | Discharge: 2013-07-20 | Disposition: A | Discharge status: Home / Self Care | Payer: Self-pay | Attending: Emergency Medicine | Admitting: Emergency Medicine

## 2013-07-20 DIAGNOSIS — Z862 Personal history of diseases of the blood and blood-forming organs and certain disorders involving the immune mechanism: Secondary | ICD-10-CM | POA: Insufficient documentation

## 2013-07-20 DIAGNOSIS — Z79899 Other long term (current) drug therapy: Secondary | ICD-10-CM | POA: Insufficient documentation

## 2013-07-20 DIAGNOSIS — R071 Chest pain on breathing: Secondary | ICD-10-CM | POA: Insufficient documentation

## 2013-07-20 DIAGNOSIS — G4700 Insomnia, unspecified: Secondary | ICD-10-CM | POA: Insufficient documentation

## 2013-07-20 DIAGNOSIS — M549 Dorsalgia, unspecified: Secondary | ICD-10-CM | POA: Insufficient documentation

## 2013-07-20 DIAGNOSIS — Z87891 Personal history of nicotine dependence: Secondary | ICD-10-CM | POA: Insufficient documentation

## 2013-07-20 DIAGNOSIS — F411 Generalized anxiety disorder: Secondary | ICD-10-CM | POA: Insufficient documentation

## 2013-07-20 DIAGNOSIS — Z8639 Personal history of other endocrine, nutritional and metabolic disease: Secondary | ICD-10-CM | POA: Insufficient documentation

## 2013-07-20 DIAGNOSIS — Z872 Personal history of diseases of the skin and subcutaneous tissue: Secondary | ICD-10-CM | POA: Insufficient documentation

## 2013-07-20 DIAGNOSIS — F3289 Other specified depressive episodes: Secondary | ICD-10-CM | POA: Insufficient documentation

## 2013-07-20 DIAGNOSIS — R0789 Other chest pain: Secondary | ICD-10-CM

## 2013-07-20 DIAGNOSIS — F329 Major depressive disorder, single episode, unspecified: Secondary | ICD-10-CM | POA: Insufficient documentation

## 2013-07-20 LAB — TROPONIN I: Troponin I: <0.3 ng/mL (ref <0.30)

## 2013-07-20 MED ORDER — IBUPROFEN 800 MG PO TABS: 800.0000 mg | ORAL_TABLET | Freq: Three times a day (TID) | ORAL | Status: DC | PRN

## 2013-07-20 MED ORDER — IBUPROFEN 800 MG PO TABS
800.0000 mg | ORAL_TABLET | Freq: Once | ORAL | Status: AC
  Administered 2013-07-20: 800 mg via ORAL
  Filled 2013-07-20: qty 1

## 2013-07-20 NOTE — ED Provider Notes (Signed)
  Medical screening examination/treatment/procedure(s) were performed by non-physician practitioner and as supervising physician I was immediately available for consultation/collaboration.    Jolynda Townley, MD 07/20/13 2319 

## 2013-07-20 NOTE — ED Provider Notes (Signed)
CSN: [id number]     Arrival date & time 07/20/13  1349 History   First MD Initiated Contact with Patient 07/20/13 1431     Chief Complaint  Patient presents with  . Chest Pain   (Consider location/radiation/quality/duration/timing/severity/associated sxs/prior Treatment) HPI Comments: Otherwise healthy 27 year old male who presents with left anterior chest pain - worse with movement and palpation of the chest.  States began to notice the pain radiating to his left shoulder and some into his back and he became concerned with this.  He reports that his last cholesterol was 240 and that he and his PCP were trying diet and exercize.  He denies shortness of breath, numbness tingling, diaphoresis, cough, congtestion, pain with deep inspiration, calf or leg pain, exogenous estrogen use currently, surgery or cancer.  Patient is a 27 y.o. male presenting with chest pain. The history is provided by the patient and a relative. No language interpreter was used.  Chest Pain Pain location:  L chest Pain quality: aching, dull and radiating   Pain radiates to:  L shoulder Pain radiates to the back: yes   Pain severity:  Moderate Onset quality:  Gradual Duration:  2 days Timing:  Intermittent Progression:  Waxing and waning Chronicity:  New Context: movement   Context: not breathing, no drug use, not eating, no intercourse, not lifting, not raising an arm, not at rest, no stress and no trauma   Relieved by:  Rest Worsened by:  Movement Ineffective treatments:  None tried Associated symptoms: back pain   Associated symptoms: no abdominal pain, no AICD problem, no altered mental status, no anorexia, no anxiety, no claudication, no cough, no diaphoresis, no dizziness, no dysphagia, no fatigue, no fever, no headache, no heartburn, no lower extremity edema, no nausea, no near-syncope, no numbness, no orthopnea, no palpitations, no PND, no shortness of breath, no syncope, not vomiting and no weakness   Risk  factors: high cholesterol, male sex and smoking   Risk factors: no coronary artery disease, no diabetes mellitus, no Ehlers-Danlos syndrome, no hypertension, no prior DVT/PE and no surgery     Past Medical History  Diagnosis Date  . Insomnia   . Anxiety   . Allergic rhinitis   . Eczema   . 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 HYPERLIPIDEMIA 11/16/2010  . Depression   . Hypercholesteremia    Past Surgical History  Procedure Laterality Date  . No past surgeries     Family History  Problem Relation Age of Onset  . Colon cancer Neg Hx   . Prostate cancer Neg Hx   . Diabetes Neg Hx   . Heart attack Neg Hx    History  Substance Use Topics  . Smoking status: Former Smoker    Quit date: 04/13/2011  . Smokeless tobacco: Never Used  . Alcohol Use: Yes     Comment: Socially    Review of Systems  Constitutional: Negative for fever, diaphoresis and fatigue.  HENT: Negative for trouble swallowing.   Respiratory: Negative for cough and shortness of breath.   Cardiovascular: Positive for chest pain. Negative for palpitations, orthopnea, claudication, syncope, PND and near-syncope.  Gastrointestinal: Negative for heartburn, nausea, vomiting, abdominal pain and anorexia.  Musculoskeletal: Positive for back pain.  Neurological: Negative for dizziness, weakness, numbness and headaches.  All other systems reviewed and are negative.    Allergies  Review of patient's allergies indicates no known allergies.  Home Medications   Current Outpatient Rx  Name  Route  Sig  Dispense  Refill  . ALPRAZolam (XANAX) 0.5 MG tablet      take 1 tablet by mouth four times a day if needed   90 tablet   0     Advise pt. No further refills without office visit ...   . ibuprofen (ADVIL,MOTRIN) 600 MG tablet   Oral   Take 1 tablet (600 mg total) by mouth every 8 (eight) hours as needed for pain.   15 tablet   0   .  ibuprofen (ADVIL,MOTRIN) 800 MG tablet   Oral   Take 1 tablet (800 mg total) by mouth every 8 (eight) hours as needed for pain.   30 tablet   0   . sertraline (ZOLOFT) 100 MG tablet   Oral   Take 200 mg by mouth daily.         . traZODone (DESYREL) 50 MG tablet   Oral   Take 1 tablet (50 mg total) by mouth at bedtime as needed for sleep.   30 tablet   4    BP 105/64  Pulse 64  Temp(Src) 97.9 F (36.6 C) (Oral)  Resp 16  Ht 5\' 5"  (1.651 m)  Wt 180 lb (81.647 kg)  BMI 29.95 kg/m2  SpO2 100% Physical Exam  Nursing note and vitals reviewed. Constitutional: He is oriented to person, place, and time. He appears well-developed and well-nourished. No distress.  HENT:  Head: Normocephalic and atraumatic.  Right Ear: External ear normal.  Left Ear: External ear normal.  Nose: Nose normal.  Mouth/Throat: Oropharynx is clear and moist. No oropharyngeal exudate.  Eyes: Conjunctivae are normal. No scleral icterus.  Neck: Normal range of motion. Neck supple.  Cardiovascular: Normal rate, regular rhythm, normal heart sounds and intact distal pulses.  Exam reveals no gallop and no friction rub.   No murmur heard. Pulmonary/Chest: Effort normal and breath sounds normal. No respiratory distress. He has no wheezes. He has no rales. He exhibits tenderness.    Abdominal: Soft. Bowel sounds are normal. He exhibits no distension. There is no tenderness.  Musculoskeletal: Normal range of motion. He exhibits no edema and no tenderness.  Lymphadenopathy:    He has no cervical adenopathy.  Neurological: He is alert and oriented to person, place, and time. No cranial nerve deficit. He exhibits normal muscle tone. Coordination normal.  Skin: Skin is warm and dry. No rash noted. No erythema. No pallor.  Psychiatric: He has a normal mood and affect. His behavior is normal. Judgment and thought content normal.    ED Course  Procedures (including critical care time) Labs Review Labs Reviewed    TROPONIN I   Imaging Review Dg Chest 2 View  07/20/2013   CLINICAL DATA:  Left upper chest pain. Dizziness.  EXAM: CHEST  2 VIEW  COMPARISON:  03/23/2011  FINDINGS: The heart size and mediastinal contours are within normal limits. Both lungs are clear. The visualized skeletal structures are unremarkable.  IMPRESSION: No active cardiopulmonary disease.   Electronically Signed   By: Charlett Nose M.D.   On: 07/20/2013 15:32   Results for orders placed during the hospital encounter of 07/20/13  TROPONIN I      Result Value Range   Troponin I <0.30  <0.30 ng/mL   Dg Chest 2 View  07/20/2013   CLINICAL DATA:  Left upper chest pain. Dizziness.  EXAM: CHEST  2 VIEW  COMPARISON:  03/23/2011  FINDINGS:  The heart size and mediastinal contours are within normal limits. Both lungs are clear. The visualized skeletal structures are unremarkable.  IMPRESSION: No active cardiopulmonary disease.   Electronically Signed   By: Charlett Nose M.D.   On: 07/20/2013 15:32     Date: 07/20/2013  Rate: 62  Rhythm: normal sinus rhythm with sinus arrythmia  QRS Axis: normal  Intervals: normal  ST/T Wave abnormalities: normal  Conduction Disutrbances: none  Narrative Interpretation:   Old EKG Reviewed: on 23 Mar 2011 No significant changes noted    MDM   1. Chest wall pain    Patient here with likely chest wall pain, though I considered the possibility of CAD, PE, CAP, HIV related infections - patient has normal EKG and initial troponin normal - I do not feel the need to repeat the troponin as the pain started 2 days ago.  Though, in the past he has been on exogenous estrogen, he is no longer on this and has low risk factors for PE at this time.  The pain is not pleuritic in nature and reproducible on exam.  He reports his last HIV test (about 3 months ago) was negative and I do not suspect and AIDS related cause to this.     Izola Price Marisue Humble, PA-C 07/20/13 1711

## 2013-07-20 NOTE — ED Notes (Signed)
Pt c/o left upper chest pain radiating to left shoulder and back x 2 days. Pt reports pain is dull and constant. Pt sts he has had pain before but usually goes away on own.

## 2013-07-22 ENCOUNTER — Encounter: Payer: Self-pay | Admitting: Internal Medicine

## 2013-07-22 ENCOUNTER — Ambulatory Visit (INDEPENDENT_AMBULATORY_CARE_PROVIDER_SITE_OTHER): Payer: Self-pay | Admitting: Internal Medicine

## 2013-07-22 VITALS — BP 119/73 | HR 92 | Temp 98.4°F | Ht 66.4 in | Wt 176.0 lb

## 2013-07-22 DIAGNOSIS — N209 Urinary calculus, unspecified: Secondary | ICD-10-CM

## 2013-07-22 DIAGNOSIS — F411 Generalized anxiety disorder: Secondary | ICD-10-CM

## 2013-07-22 DIAGNOSIS — Z0000 Encounter for general adult medical examination without abnormal findings: Secondary | ICD-10-CM

## 2013-07-22 HISTORY — DX: Urinary calculus, unspecified: N20.9

## 2013-07-22 LAB — CBC WITH DIFFERENTIAL/PLATELET
Basophils Absolute: 0 10*3/uL (ref 0.0–0.1)
HCT: 45 % (ref 39.0–52.0)
Hemoglobin: 15.3 g/dL (ref 13.0–17.0)
Lymphs Abs: 2.9 10*3/uL (ref 0.7–4.0)
MCHC: 34 g/dL (ref 30.0–36.0)
MCV: 87.2 fl (ref 78.0–100.0)
Monocytes Absolute: 0.6 10*3/uL (ref 0.1–1.0)
Monocytes Relative: 8.8 % (ref 3.0–12.0)
Neutro Abs: 3.5 10*3/uL (ref 1.4–7.7)
RDW: 13.6 % (ref 11.5–14.6)

## 2013-07-22 LAB — COMPREHENSIVE METABOLIC PANEL
ALT: 32 U/L (ref 0–53)
AST: 22 U/L (ref 0–37)
Alkaline Phosphatase: 52 U/L (ref 39–117)
Creatinine, Ser: 0.7 mg/dL (ref 0.4–1.5)
GFR: 137.84 mL/min (ref >60.00)
Sodium: 138 mEq/L (ref 135–145)
Total Bilirubin: 0.9 mg/dL (ref 0.3–1.2)
Total Protein: 7.7 g/dL (ref 6.0–8.3)

## 2013-07-22 LAB — LDL CHOLESTEROL, DIRECT: Direct LDL: 156.1 mg/dL

## 2013-07-22 LAB — LIPID PANEL: VLDL: 58.2 mg/dL — ABNORMAL HIGH (ref 0.0–40.0)

## 2013-07-22 MED ORDER — ALPRAZOLAM 0.5 MG PO TABS: 0.5000 mg | ORAL_TABLET | Freq: Four times a day (QID) | ORAL | Status: DC | PRN

## 2013-07-22 NOTE — Assessment & Plan Note (Signed)
Currently well controlled on Zoloft, uses Xanax approximately 4 times a day. Uses trazodone as needed. Plan: Controlled substance contract, UDS RF prn Must RTC q 6 months

## 2013-07-22 NOTE — Assessment & Plan Note (Addendum)
Td 2013  counseled about exercise, diet, STE., safe sex Pt is transgender, reports is nt sexually active, not taking any HRT. RTC 6 months  labs  , offered HIV

## 2013-07-22 NOTE — Patient Instructions (Signed)
Get your blood work before you leave  Next visit in 6 moths or a routine office visit Please make an appointment before you leave the office today (or call few weeks in advance) -----  Safe Sex Safe sex is about reducing the risk of giving or getting a sexually transmitted disease (STD). STDs are spread through sexual contact involving the genitals, mouth, or rectum. Some STDS can be cured and others cannot. Safe sex can also prevent unintended pregnancies.  SAFE SEX PRACTICES  Limit your sexual activity to only one partner who is only having sex with you.  Talk to your partner about their past partners, past STDs, and drug use.  Use a condom every time you have sexual intercourse. This includes vaginal, oral, and anal sexual activity. Both females and males should wear condoms during oral sex. Only use latex or polyurethane condoms and water-based lubricants. Petroleum-based lubricants or oils used to lubricate a condom will weaken the condom and increase the chance that it will break. The condom should be in place from the beginning to the end of sexual activity. Wearing a condom reduces, but does not completely eliminate, your risk of getting or giving a STD. STDs can be spread by contact with skin of surrounding areas.  Get vaccinated for hepatitis B and HPV.  Avoid alcohol and recreational drugs which can affect your judgement. You may forget to use a condom or participate in high-risk sex.  For females, avoid douching after sexual intercourse. Douching can spread an infection farther into the reproductive tract.  Check your body for signs of sores, blisters, rashes, or unusual discharge. See your caregiver if you notice any of these signs.  Avoid sexual contact if you have symptoms of an infection or are being treated for an STD. If you or your partner has herpes, avoid sexual contact when blisters are present. Use condoms at all other times.  See your caregiver for regular screenings,  examinations, and tests for STDs. Before having sex with a new partner, each of you should be screened for STDs and talk about the results with your partner. BENEFITS OF SAFE SEX   There is less of a chance of getting or giving an STD.  You can prevent unwanted or unintended pregnancies.  By discussing safer sex concerns with your partner, you may increase feelings of intimacy, comfort, trust, and honesty between the both of you. Document Released: 12/06/2004 Document Revised: 07/23/2012 Document Reviewed: 04/21/2012 [facility] Patient Information 2014 [place], [place].     Testicular Problems and Self-Exam Men can examine themselves easily and effectively with positive results. Monthly exams detect problems early and save lives. There are numerous causes of swelling in the testicle. Testicular cancer usually appears as a firm painless lump in the front part of the testicle. This may feel like a dull ache or heavy feeling located in the lower abdomen (belly), groin, or scrotum.  The risk is greater in men with undescended testicles and it is more common in young men. It is responsible for almost a fifth of cancers in males between ages 79 and 74. Other common causes of swellings, lumps, and testicular pain include injuries, inflammation (soreness) from infection, hydrocele, and torsion. These are a few of the reasons to do monthly self-examination of the testicles. The exam only takes minutes and could add years to your life. Get in the habit! SELF-EXAMINATION OF THE TESTICLES The testicles are easiest to examine after warm baths or showers and are more difficult to examine  when you are cold. This is because the muscles attached to the testicles retract and pull them up higher or into the abdomen. While standing, roll one testicle between the thumb and forefinger. Feel for lumps, swelling, or discomfort. A normal testicle is egg shaped and feels firm. It is smooth and not tender. The spermatic cord  can be felt as a firm spaghetti-like cord at the back of the testicle. It is also important to examine your groins. This is the crease between the front of your leg and your abdomen. Also, feel for enlarged lymph nodes (glands). Enlarged nodes are also a cause for you to see your caregiver for evaluation.  Self-examination of the testicles and groin areas on a regular basis will help you to know what your own testicles and groins feel like. This will help you pick up an abnormality (difference) at an earlier stage. Early discovery is the key to curing this cancer or treating other conditions. Any lump, change, or swelling in the testicle calls for immediate evaluation by your caregiver. Cancer of the testicle does not result in impotence and it does not prevent normal intercourse or prevent having children. If your caregiver feels that medical treatment or chemotherapy could lead to infertility, sperm can be frozen for future use. It is necessary to see a caregiver as soon as possible after the discovery of a lump in a testicle. Document Released: 02/04/2001 Document Revised: 01/21/2012 Document Reviewed: 10/30/2008 [facility] Patient Information 2014 [place], [place].

## 2013-07-22 NOTE — Progress Notes (Signed)
  Subjective:    Patient ID: Ethan Harris, male    DOB: 1986/04/09, 27 y.o.   MRN: [id number]  HPI CPX  Past Medical History  Diagnosis Date  . Insomnia   . Anxiety and depression   . Allergic rhinitis   . Eczema   . 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 HYPERLIPIDEMIA 11/16/2010  . Hypercholesteremia   . Urolithiasis 07/22/2013   Past Surgical History  Procedure Laterality Date  . No past surgeries     History   Social History  . Marital Status: Single    Spouse Name: N/A    Number of Children: 0  . Years of Education: N/A   Occupational History  . entertainer     Social History Main Topics  . Smoking status: Former Smoker    Quit date: 04/13/2011  . Smokeless tobacco: Never Used  . Alcohol Use: Yes     Comment: Socially  . Drug Use: No  . Sexual Activity: Yes   Other Topics Concern  . Not on file   Social History Narrative   Lives w/ room mates (mom- Jennifer [place])    Denies being sex active at present     Family History  Problem Relation Age of Onset  . Colon cancer Neg Hx   . Prostate cancer Neg Hx   . Diabetes Neg Hx   . Heart attack Neg Hx     Review of Systems Went to the ER with chest pain, chest x-ray, EKG and troponins were negative. Chest pain resolved. One-year history of occasional burning feeling in the plantar area, bilaterally. Usually after a long day of standing or walking. Denies fever or chills No cough, shortness of breath.  No nausea, vomiting, diarrhea or blood in the stools. No dysuria gross hematuria.     Objective:   Physical Exam  BP 119/73  Pulse 92  Temp(Src) 98.4 F (36.9 C)  Ht 5' 6.4" (1.687 m)  Wt 176 lb (79.833 kg)  BMI 28.05 kg/m2  SpO2 98% General -- alert, well-developed, NAD.  Neck --no thyromegaly Lungs -- normal respiratory effort, no intercostal retractions, no accessory muscle use, and normal breath sounds.  Heart-- normal  rate, regular rhythm, no murmur.  Abdomen-- Not distended, good bowel sounds,soft, non-tender. No rebound or rigidity.  Extremities-- no pretibial edema bilaterally , normal pedal pulses and capillary refill throughout Neurologic-- alert & oriented X3. Speech, gait normal. Strength normal in all extremities.  Psych-- Cognition and judgment appear intact. Alert and cooperative with normal attention span and concentration. not anxious appearing and not depressed appearing.      Assessment & Plan:

## 2013-07-23 LAB — HIV ANTIBODY (ROUTINE TESTING W REFLEX): HIV: NONREACTIVE

## 2013-07-28 ENCOUNTER — Telehealth: Payer: Self-pay | Admitting: *Deleted

## 2013-07-28 ENCOUNTER — Encounter: Payer: Self-pay | Admitting: *Deleted

## 2013-07-28 NOTE — Telephone Encounter (Signed)
thx

## 2013-07-28 NOTE — Telephone Encounter (Signed)
Pt notified of lab results and to eat healthy diet and exercise regularly to get cholesterol down. Pt scheduled for 01/25/14 lab visit. Results sent. DJR

## 2013-07-28 NOTE — Telephone Encounter (Signed)
Message copied by Eustace Quail on Tue Jul 28, 2013  1:46 PM ------      Message from: Willow Ora E      Created: Tue Jul 28, 2013 12:31 PM       Call the patient:      All  results are normal except for a cholesterol and triglycerides which are elevated.      A healthy diet and regular exercise are extremely important to get the cholesterol down. If he needs more information about diet we can send him to a nutritionist.      Plan to recheck his labs when he comes back in 6 months, if the cholesterol is still elevated, will have to discuss medication.      Also, send the patient a copy of his results ------

## 2013-07-30 ENCOUNTER — Telehealth: Payer: Self-pay | Admitting: Internal Medicine

## 2013-07-30 NOTE — Telephone Encounter (Signed)
UDS + for alprazolam but negative for sertraline. LMOM: requested a call back

## 2013-07-31 NOTE — Telephone Encounter (Signed)
Spoke with pt regarding UDS, pt states he is not currently taking Zoloft but will start it back today. I advised pt that all prescribed medications need to be taken as directed in order to work effectively.

## 2013-08-21 ENCOUNTER — Encounter: Payer: Self-pay | Admitting: Internal Medicine

## 2013-09-01 ENCOUNTER — Telehealth: Payer: Self-pay | Admitting: *Deleted

## 2013-09-01 ENCOUNTER — Other Ambulatory Visit: Payer: Self-pay | Admitting: Internal Medicine

## 2013-09-01 NOTE — Telephone Encounter (Signed)
rx request-Xanax 0.5mg  Last refilled- 07/22/13 #90 / 0 rf  Last OV- 07/22/13  UDS contract 07/1013  Please advise. DJR

## 2013-09-02 MED ORDER — ALPRAZOLAM 0.5 MG PO TABS: 0.5000 mg | ORAL_TABLET | Freq: Three times a day (TID) | ORAL | Status: DC | PRN

## 2013-09-02 NOTE — Telephone Encounter (Signed)
Done. Ethan Harris

## 2013-09-02 NOTE — Telephone Encounter (Addendum)
Last UDS was + xanax but did not show sertraline, he was supposed to restart sertraline w/ the idea he will need less benzos. For now [facility] 90, no RF rx printed , change sig to TID

## 2013-09-02 NOTE — Addendum Note (Signed)
Addended by: Willow Ora E on: 09/02/2013 09:53 AM   Modules accepted: Orders

## 2013-09-03 IMAGING — CT CT ABD-PELV W/O CM
2 of 4 series · 16 of 46 positions shown, 18 images · non-contrast
Comparison: 05/21/2012

CLINICAL DATA: Left flank pain radiating to the umbilical region
and groin.  Hematuria.

CT ABDOMEN AND PELVIS WITHOUT CONTRAST
TECHNIQUE: Multidetector CT imaging of the abdomen and pelvis was
performed following the standard protocol without intravenous
contrast.

[Series 2: renal stone < 200 lbs 5.0 b31f · axial · 0.65mm/px · z∈[-524,-164]mm · 13 of 80 slices shown, 15 images]
[im 4/80  soft-tissue]
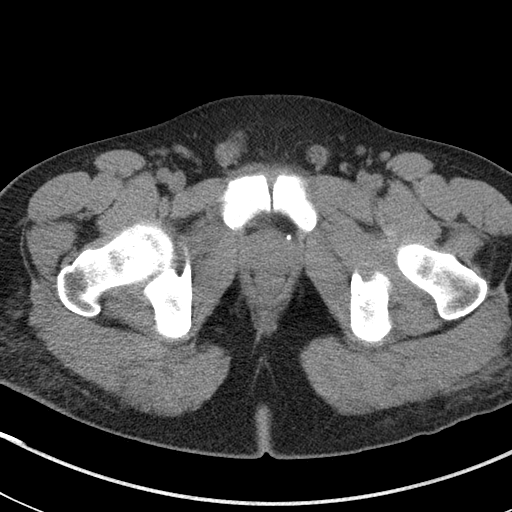
[im 4/80  bone]
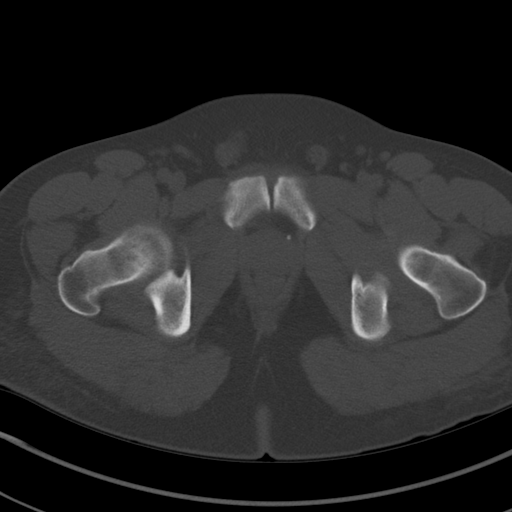
[im 11/80  soft-tissue]
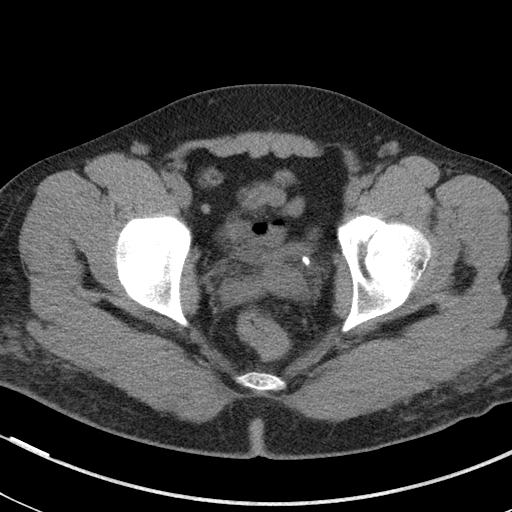
[im 18/80  soft-tissue]
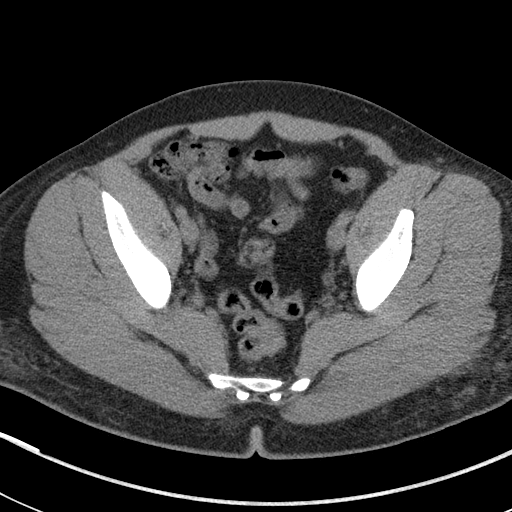
[im 21/80  soft-tissue]
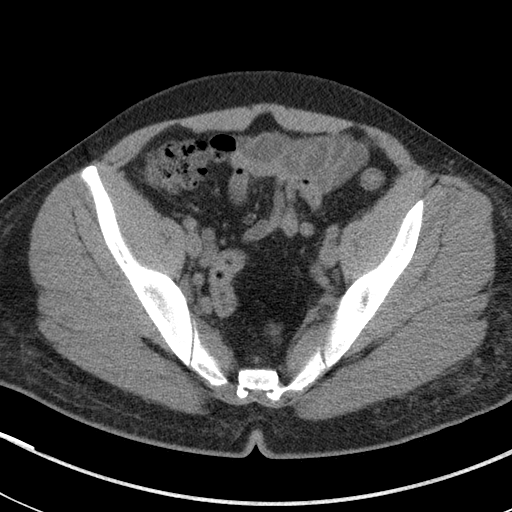
[im 28/80  soft-tissue]
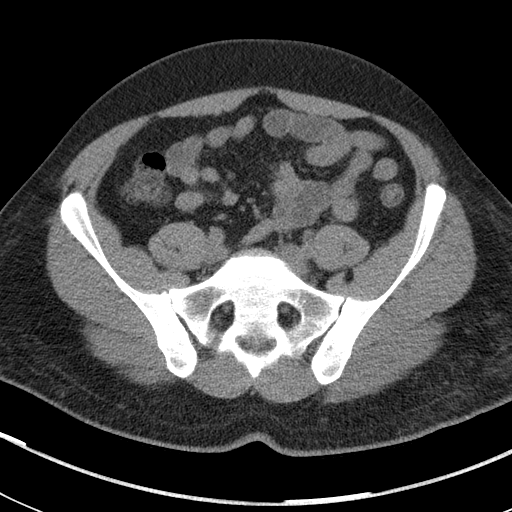
[im 35/80  soft-tissue]
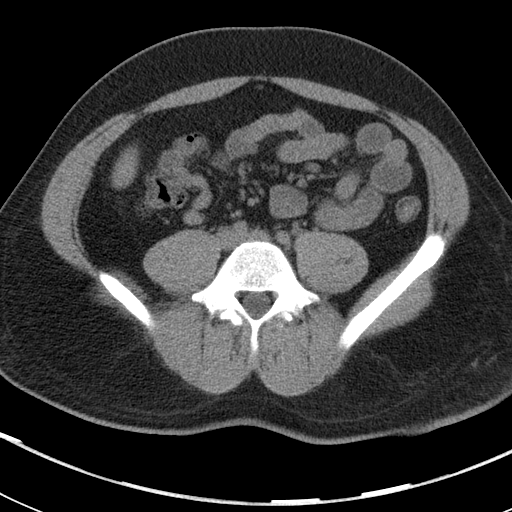
[im 42/80  soft-tissue]
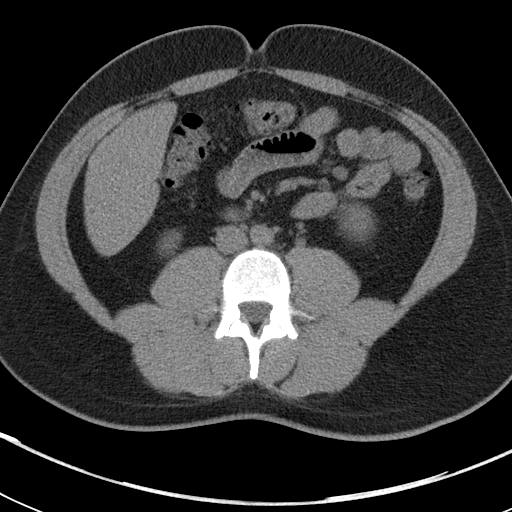
[im 45/80  soft-tissue]
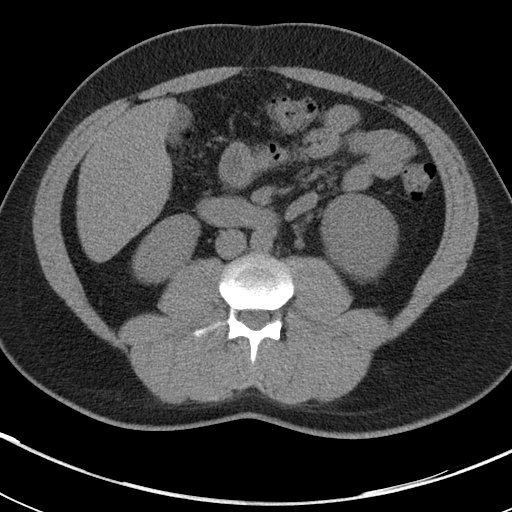
[im 52/80  soft-tissue]
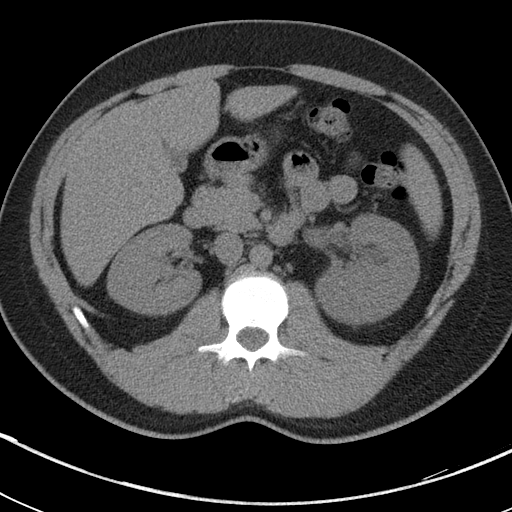
[im 52/80  bone]
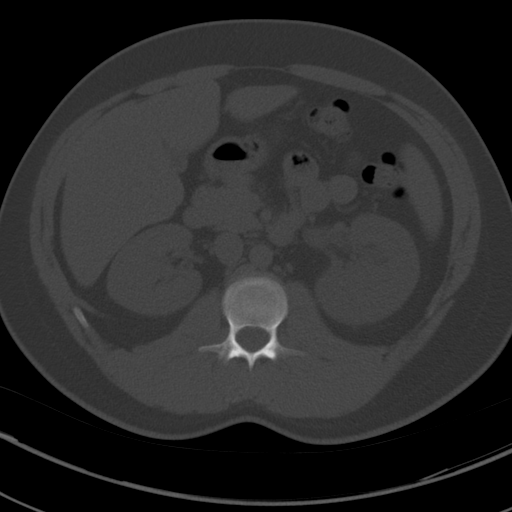
[im 59/80  soft-tissue]
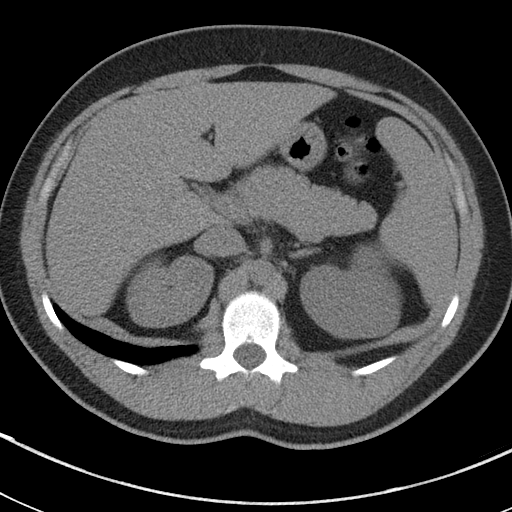
[im 62/80  soft-tissue]
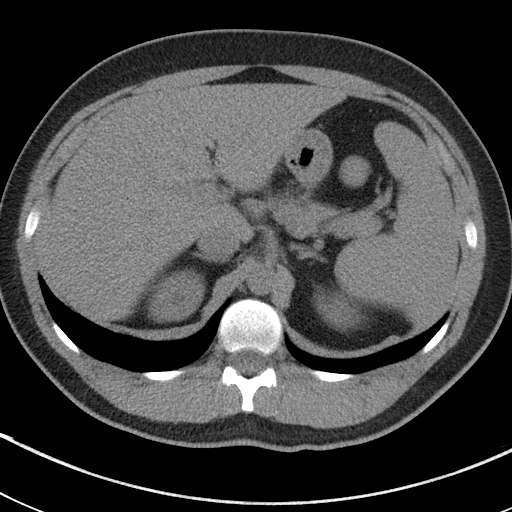
[im 69/80  soft-tissue]
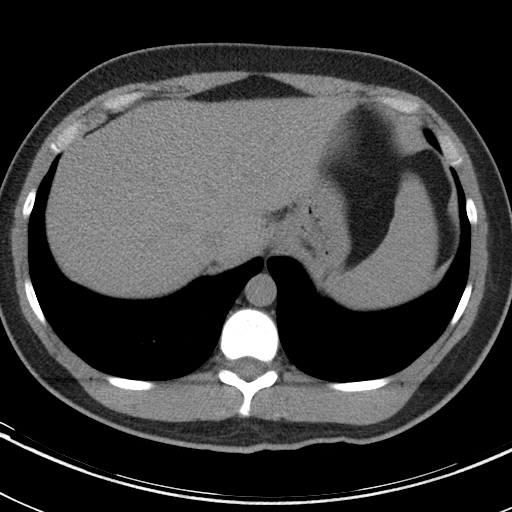
[im 76/80  soft-tissue]
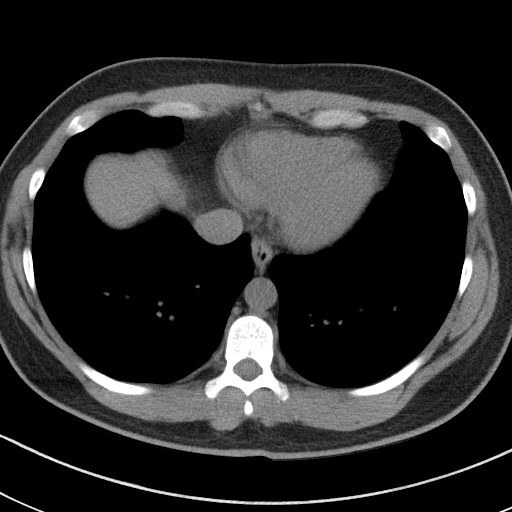

[Series 4: renal stone 3.0 coronal · coronal · 0.62mm/px · 3 of 83 slices shown]
[im 28/83  soft-tissue]
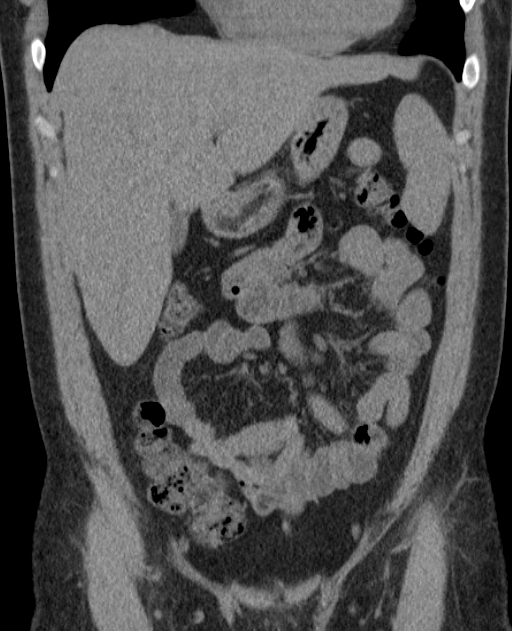
[im 37/83  soft-tissue]
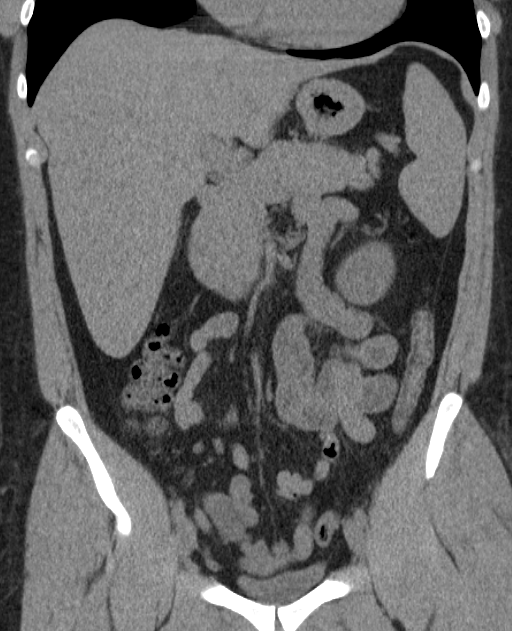
[im 46/83  soft-tissue]
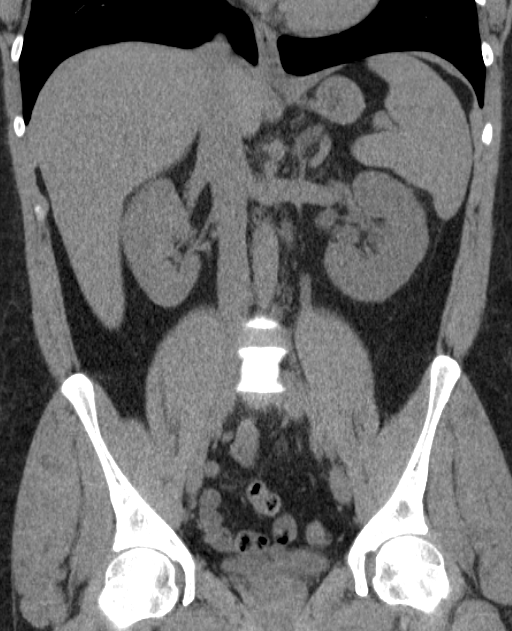

[16 of 46 positions shown; findings below may reference images not displayed]

FINDINGS: The lung bases are clear.  6 mm calculus in the distal
left ureter at the ureterovesicle junction.  Proximal
pyelocaliectasis and ureterectasis.  Since  previous study, the
stone has migrated from the ureteropelvic junction to the present
location in the distal left ureter.  No right renal or ureteral
stones.  The right renal collecting system is decompressed.  The
bladder is decompressed and cannot be evaluated for wall thickness.

The unenhanced appearance of the liver, spleen, gallbladder,
pancreas, adrenal glands, abdominal aorta, and retroperitoneal
lymph nodes is unremarkable.  The stomach, small bowel, and colon
are decompressed.  Small accessory spleen.  No free air or free
fluid in the abdomen.

Pelvis:  Appendix is normal.  No diverticulitis.  Prostate gland is
not enlarged.  No free or loculated pelvic fluid collections.
IMPRESSION: 6 mm stone in the distal left ureter with proximal ureteral
obstruction.

## 2013-09-21 ENCOUNTER — Encounter (HOSPITAL_BASED_OUTPATIENT_CLINIC_OR_DEPARTMENT_OTHER): Payer: Self-pay | Admitting: Emergency Medicine

## 2013-09-21 ENCOUNTER — Emergency Department (HOSPITAL_BASED_OUTPATIENT_CLINIC_OR_DEPARTMENT_OTHER)
Admission: EM | Admit: 2013-09-21 | Discharge: 2013-09-21 | Disposition: A | Discharge status: Home / Self Care | Payer: Self-pay | Attending: Emergency Medicine | Admitting: Emergency Medicine

## 2013-09-21 DIAGNOSIS — Z8639 Personal history of other endocrine, nutritional and metabolic disease: Secondary | ICD-10-CM | POA: Insufficient documentation

## 2013-09-21 DIAGNOSIS — Z862 Personal history of diseases of the blood and blood-forming organs and certain disorders involving the immune mechanism: Secondary | ICD-10-CM | POA: Insufficient documentation

## 2013-09-21 DIAGNOSIS — R599 Enlarged lymph nodes, unspecified: Secondary | ICD-10-CM | POA: Insufficient documentation

## 2013-09-21 DIAGNOSIS — F329 Major depressive disorder, single episode, unspecified: Secondary | ICD-10-CM | POA: Insufficient documentation

## 2013-09-21 DIAGNOSIS — Z872 Personal history of diseases of the skin and subcutaneous tissue: Secondary | ICD-10-CM | POA: Insufficient documentation

## 2013-09-21 DIAGNOSIS — R591 Generalized enlarged lymph nodes: Secondary | ICD-10-CM

## 2013-09-21 DIAGNOSIS — Z87448 Personal history of other diseases of urinary system: Secondary | ICD-10-CM | POA: Insufficient documentation

## 2013-09-21 DIAGNOSIS — Z87891 Personal history of nicotine dependence: Secondary | ICD-10-CM | POA: Insufficient documentation

## 2013-09-21 DIAGNOSIS — F3289 Other specified depressive episodes: Secondary | ICD-10-CM | POA: Insufficient documentation

## 2013-09-21 DIAGNOSIS — Z79899 Other long term (current) drug therapy: Secondary | ICD-10-CM | POA: Insufficient documentation

## 2013-09-21 DIAGNOSIS — F411 Generalized anxiety disorder: Secondary | ICD-10-CM | POA: Insufficient documentation

## 2013-09-21 NOTE — ED Provider Notes (Signed)
CSN: [id number]     Arrival date & time 09/21/13  1013 History   First MD Initiated Contact with Patient 09/21/13 1037     Chief Complaint  Patient presents with  . Mass    lower lef groin   (Consider location/radiation/quality/duration/timing/severity/associated sxs/prior Treatment) HPI Comments: Pt states that he has an area of swelling in his left groin:pt states that the area is tender to touch:states that he noticed it this morning  The history is provided by the patient. No language interpreter was used.    Past Medical History  Diagnosis Date  . Insomnia   . Anxiety and depression   . Allergic rhinitis   . Eczema   . 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 HYPERLIPIDEMIA 11/16/2010  . Hypercholesteremia   . Urolithiasis 07/22/2013   Past Surgical History  Procedure Laterality Date  . No past surgeries     Family History  Problem Relation Age of Onset  . Colon cancer Neg Hx   . Prostate cancer Neg Hx   . Diabetes Neg Hx   . Heart attack Neg Hx    History  Substance Use Topics  . Smoking status: Former Smoker    Quit date: 04/13/2011  . Smokeless tobacco: Never Used  . Alcohol Use: Yes     Comment: Socially    Review of Systems  Constitutional: Negative for fever.  Respiratory: Negative.   Cardiovascular: Negative.     Allergies  Review of patient's allergies indicates no known allergies.  Home Medications   Current Outpatient Rx  Name  Route  Sig  Dispense  Refill  . ALPRAZolam (XANAX) 0.5 MG tablet   Oral   Take 1 tablet (0.5 mg total) by mouth 3 (three) times daily as needed for sleep or anxiety.   90 tablet   0   . sertraline (ZOLOFT) 100 MG tablet   Oral   Take 200 mg by mouth daily.         . traZODone (DESYREL) 50 MG tablet   Oral   Take 1 tablet (50 mg total) by mouth at bedtime as needed for sleep.   30 tablet   4    BP 111/64  Pulse 85  Temp(Src) 99  F (37.2 C) (Oral)  Resp 20  Wt 175 lb (79.379 kg)  SpO2 100% Physical Exam  Nursing note and vitals reviewed. Constitutional: He is oriented to person, place, and time. He appears well-developed and well-nourished.  HENT:  Head: Normocephalic and atraumatic.  Cardiovascular: Normal rate and regular rhythm.   Pulmonary/Chest: Effort normal and breath sounds normal.  Genitourinary:  Small fluctuant node noted to the left groin  Musculoskeletal: Normal range of motion.  Neurological: He is alert and oriented to person, place, and time.  Skin:  Mild irritation noted from shaving    ED Course  Procedures (including critical care time) Labs Review Labs Reviewed - No data to display Imaging Review No results found.  EKG Interpretation   None       MDM   1. Lymphadenopathy    Discussed taking an anti inflammatory with [id number] further to be done at this time    Teressa Lower, NP 09/21/13 1111

## 2013-09-21 NOTE — ED Notes (Signed)
Pt in with c/o possible swollen node to lower left groin, tender to palpation, denies drainage

## 2013-09-22 NOTE — ED Provider Notes (Signed)
Medical screening examination/treatment/procedure(s) were performed by non-physician practitioner and as supervising physician I was immediately available for consultation/collaboration.    Keelon Zurn L Verda Mehta, MD 09/22/13 2314 

## 2013-10-15 ENCOUNTER — Other Ambulatory Visit: Payer: Self-pay | Admitting: Internal Medicine

## 2013-10-16 ENCOUNTER — Telehealth: Payer: Self-pay | Admitting: *Deleted

## 2013-10-16 ENCOUNTER — Other Ambulatory Visit: Payer: Self-pay | Admitting: *Deleted

## 2013-10-16 MED ORDER — ALPRAZOLAM 0.5 MG PO TABS: 0.5000 mg | ORAL_TABLET | Freq: Three times a day (TID) | ORAL | Status: DC | PRN

## 2013-10-16 NOTE — Telephone Encounter (Signed)
I don't see a UDS, unable to RF until UDS sample collected

## 2013-10-16 NOTE — Telephone Encounter (Signed)
Patient called and informed that a UDS is required to pick up prescription.

## 2013-10-16 NOTE — Telephone Encounter (Signed)
ALPRAZolam (XANAX) 0.5 MG tablet Last refill: 09/02/13 #90, 0 refills Last OV: 07/22/13 Contract on file

## 2013-10-16 NOTE — Telephone Encounter (Signed)
Alprazolam refilled per protocol. Patient called and informed that script is ready for pick up and that a UDS is required to pick up.

## 2013-11-01 ENCOUNTER — Telehealth: Payer: Self-pay | Admitting: Internal Medicine

## 2013-11-01 NOTE — Telephone Encounter (Signed)
Advise pt-- needs OV to discuss results of UDS, please arrange

## 2013-11-02 NOTE — Telephone Encounter (Signed)
LMOVM

## 2013-11-04 ENCOUNTER — Telehealth: Payer: Self-pay | Admitting: *Deleted

## 2013-11-04 NOTE — Telephone Encounter (Signed)
Patients UDS was negative for Xanax (Rx #90 given on 09/02/13), per provider pt may have run out of Xanax but pt is not taking Sertraline and was +for ETOH 64.4 out of 10mg /dL.   Left message on voice mail for patient to cal the office to schedule appt.

## 2013-11-04 NOTE — Telephone Encounter (Signed)
error 

## 2013-11-16 ENCOUNTER — Ambulatory Visit (INDEPENDENT_AMBULATORY_CARE_PROVIDER_SITE_OTHER): Payer: Self-pay | Admitting: Internal Medicine

## 2013-11-16 ENCOUNTER — Encounter: Payer: Self-pay | Admitting: Internal Medicine

## 2013-11-16 VITALS — BP 158/89 | HR 93 | Temp 97.9°F | Wt 177.0 lb

## 2013-11-16 DIAGNOSIS — F411 Generalized anxiety disorder: Secondary | ICD-10-CM

## 2013-11-16 MED ORDER — SERTRALINE HCL 100 MG PO TABS: 200.0000 mg | ORAL_TABLET | Freq: Every day | ORAL | Status: DC

## 2013-11-16 MED ORDER — ALPRAZOLAM 0.5 MG PO TABS: 0.5000 mg | ORAL_TABLET | Freq: Three times a day (TID) | ORAL | Status: DC | PRN

## 2013-11-16 NOTE — Patient Instructions (Signed)
Take meds as prescribed Come back in 3 weeks for a urine screen, make a lab appointment  Next visit is for routine check up,  in 3 months  No need to come back fasting Please make an appointment

## 2013-11-16 NOTE — Progress Notes (Signed)
Pre visit review using our clinic review tool, if applicable. No additional management support is needed unless otherwise documented below in the visit note. 

## 2013-11-16 NOTE — Assessment & Plan Note (Addendum)
UDS showed + ETOH, no alprazolam or sertraline despite the patient reports takes xanax regularly; admits he has been skipping sertraline sometimes d/t cost Denies excessive-frequent EtOH intake, denies doing illicit drugs. I told the patient the core treatment for anxiety  is sertraline and Xanax is only for as needed use. Both prescriptions were issued, repeat UDS in 3 weeks, followup in 3 months. If UDS is not consistent I will seriously consider stop prescribing controlled medications.

## 2013-11-16 NOTE — Progress Notes (Signed)
   Subjective:    Patient ID: Ethan Harris, male    DOB: [birth year], 28 y.o.   MRN: [id number]  HPI ROV Here to discuss the results of his urine drug screen---->  did not show sertraline or alprazolam, it was + for alcohol. The patient reports that he drinks ETOH twice a week on average. Has been unable to afford Sertraline every month. He takes Xanax regularly 3 times a day on average.  Past Medical History  Diagnosis Date  . Insomnia   . Anxiety and depression   . Allergic rhinitis   . Eczema   . 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 HYPERLIPIDEMIA 11/16/2010  . Hypercholesteremia   . Urolithiasis 07/22/2013   Past Surgical History  Procedure Laterality Date  . No past surgeries      Review of Systems Denies using illicit drugs. Denies having other providers prescribe controlled substances    Objective:   Physical Exam BP 158/89  Pulse 93  Temp(Src) 97.9 F (36.6 C)  Wt 177 lb (80.287 kg)  SpO2 100% General -- alert, well-developed, NAD.  Neurologic--  alert & oriented X3. Speech normal, gait normal, strength normal in all extremities.  Psych-- Cognition and judgment appear intact. Cooperative with normal attention span and concentration. No anxious or depressed appearing.      Assessment & Plan:

## 2013-11-23 ENCOUNTER — Encounter: Payer: Self-pay | Admitting: Internal Medicine

## 2013-12-07 ENCOUNTER — Other Ambulatory Visit: Payer: Self-pay

## 2013-12-14 ENCOUNTER — Telehealth: Payer: Self-pay

## 2013-12-14 NOTE — Telephone Encounter (Signed)
UDS: 12/08/2013 Positive alprazolam and sertraline Low risk per Dr Brigid RePaz Recheck in 6 months

## 2013-12-24 ENCOUNTER — Encounter: Payer: Self-pay | Admitting: Internal Medicine

## 2013-12-28 ENCOUNTER — Telehealth: Payer: Self-pay | Admitting: *Deleted

## 2013-12-28 ENCOUNTER — Other Ambulatory Visit: Payer: Self-pay | Admitting: Internal Medicine

## 2013-12-28 MED ORDER — ALPRAZOLAM 0.5 MG PO TABS
0.5000 mg | ORAL_TABLET | Freq: Three times a day (TID) | ORAL | Status: DC | PRN
Start: 2013-12-28 — End: 2014-04-20

## 2013-12-28 NOTE — Telephone Encounter (Signed)
UDS: 12/08/2013 --Low risk  Ok RF

## 2013-12-28 NOTE — Telephone Encounter (Signed)
rx faxed

## 2013-12-28 NOTE — Telephone Encounter (Signed)
rx refill- xanax 0.5mg  Last OV- 11/16/13 Last refilled- 11/16/13 #90 / 0 rf  Last UDS- 07/22/13 HIGH

## 2013-12-28 NOTE — Addendum Note (Signed)
Addended by: Willow OraPAZ, Gaspard Isbell E on: 12/28/2013 02:53 PM   Modules accepted: Orders

## 2014-01-25 ENCOUNTER — Other Ambulatory Visit (INDEPENDENT_AMBULATORY_CARE_PROVIDER_SITE_OTHER): Payer: Self-pay

## 2014-01-25 DIAGNOSIS — E785 Hyperlipidemia, unspecified: Secondary | ICD-10-CM

## 2014-01-25 LAB — LIPID PANEL
Cholesterol: 245 mg/dL — ABNORMAL HIGH (ref 0–200)
HDL: 37.8 mg/dL — ABNORMAL LOW (ref >39.00)
LDL Cholesterol: 162 mg/dL — ABNORMAL HIGH (ref 0–99)
Total CHOL/HDL Ratio: 6
Triglycerides: 227 mg/dL — ABNORMAL HIGH (ref 0.0–149.0)
VLDL: 45.4 mg/dL — AB (ref 0.0–40.0)

## 2014-02-03 ENCOUNTER — Encounter: Payer: Self-pay | Admitting: Internal Medicine

## 2014-02-08 ENCOUNTER — Telehealth: Payer: Self-pay

## 2014-02-08 NOTE — Telephone Encounter (Signed)
UDS: 01/27/2014 Positive for a-hydroxyalprazolam (neg alprazolam) Low risk per Dr Drue NovelPaz Re-check in 6 months

## 2014-02-08 NOTE — Telephone Encounter (Signed)
Error

## 2014-02-23 ENCOUNTER — Encounter: Payer: Self-pay | Admitting: Internal Medicine

## 2014-04-20 ENCOUNTER — Telehealth: Payer: Self-pay | Admitting: *Deleted

## 2014-04-20 ENCOUNTER — Other Ambulatory Visit: Payer: Self-pay | Admitting: Internal Medicine

## 2014-04-20 MED ORDER — ALPRAZOLAM 0.5 MG PO TABS: 0.5000 mg | ORAL_TABLET | Freq: Three times a day (TID) | ORAL | Status: DC | PRN

## 2014-04-20 NOTE — Addendum Note (Signed)
Addended by: Willow Ora E on: 04/20/2014 04:50 PM   Modules accepted: Orders

## 2014-04-20 NOTE — Telephone Encounter (Signed)
done

## 2014-04-20 NOTE — Telephone Encounter (Signed)
rx refill xanax 0.5mg  Last OV- 11/16/13  Last refilled- 12/28/13 # 90 / 2 rf  UDS- 02/08/14 LOW risk

## 2014-04-21 NOTE — Telephone Encounter (Signed)
rx [facility]ed to [facility]ts [facility].

## 2014-05-19 ENCOUNTER — Other Ambulatory Visit: Payer: Self-pay | Admitting: Internal Medicine

## 2014-07-13 ENCOUNTER — Other Ambulatory Visit: Payer: Self-pay | Admitting: Internal Medicine

## 2014-07-13 NOTE — Telephone Encounter (Signed)
Pt is requesting refill for Alprazolam.  Last OV: 11/16/2013 Last Fill: 04/20/2014 # 90 with 2 RF Last UDS: 01/27/2014: Low Risk.   Please Advise.

## 2014-07-15 NOTE — Telephone Encounter (Signed)
Pt is requesting refill for Alprazolam.   Last OV: 11/16/2013 Last Fill: 04/20/2014 # 90 with 2 RF Last UDS: 02/08/2014 Low Risk  Please Advise.

## 2014-07-15 NOTE — Telephone Encounter (Signed)
Patient calling back regarding this.  °

## 2014-07-16 NOTE — Telephone Encounter (Signed)
Medication faxed to pharmacy.   Pt needs to make appt within 2 months for any further refills.

## 2014-07-16 NOTE — Telephone Encounter (Signed)
Tell pt I OK the rx but needs OV within 2 months, no further RFs w/o OV

## 2014-09-21 ENCOUNTER — Other Ambulatory Visit: Payer: Self-pay | Admitting: Internal Medicine

## 2014-09-21 NOTE — Telephone Encounter (Signed)
Pt is requesting refill on Alprazolam  Last OV: 11/16/2013 Last Fill: 07/16/2014 # 90 1 RF Pt can take 1 tablet tid PRN UDS: 02/08/2014 Low risk  Please advise.

## 2014-09-21 NOTE — Telephone Encounter (Signed)
Needs OV  (ok to rx #30 tabs until he can be seen)

## 2014-09-21 NOTE — Telephone Encounter (Signed)
Rx printed

## 2014-11-09 ENCOUNTER — Telehealth: Payer: Self-pay | Admitting: Internal Medicine

## 2014-11-09 NOTE — Telephone Encounter (Signed)
Okay #90, no refills 

## 2014-11-09 NOTE — Telephone Encounter (Signed)
[facility] [address]

## 2014-11-09 NOTE — Telephone Encounter (Signed)
Pt is requesting refill on Alprazolam.  Last OV: 11/16/2013, Pt has appt scheduled 11/10/2014 @ 4. Last Fill: 09/21/2014 # 90 0RF UDS: 02/08/2014 Low risk  Please advise.

## 2014-11-10 ENCOUNTER — Encounter: Payer: Self-pay | Admitting: Internal Medicine

## 2014-11-10 ENCOUNTER — Ambulatory Visit (INDEPENDENT_AMBULATORY_CARE_PROVIDER_SITE_OTHER): Payer: Self-pay | Admitting: Internal Medicine

## 2014-11-10 VITALS — BP 121/79 | HR 77 | Temp 98.1°F | Ht 66.0 in | Wt 186.2 lb

## 2014-11-10 DIAGNOSIS — G4700 Insomnia, unspecified: Secondary | ICD-10-CM

## 2014-11-10 DIAGNOSIS — F411 Generalized anxiety disorder: Secondary | ICD-10-CM

## 2014-11-10 NOTE — Patient Instructions (Signed)
Go to the lab for a UDS  Please come back to the office in 6 months  for a physical exam. Come back fasting

## 2014-11-10 NOTE — Progress Notes (Signed)
Pre visit review using our clinic review tool, if applicable. No additional management support is needed unless otherwise documented below in the visit note. 

## 2014-11-10 NOTE — Assessment & Plan Note (Signed)
Well-controlled with Zoloft 200 mg daily and Xanax as needed on average 3 tablets daily. Plan: UDS, refill meds as needed

## 2014-11-10 NOTE — Assessment & Plan Note (Signed)
History of insomnia, lately it has not been much of a problem, uses trazodone very rarely

## 2014-11-10 NOTE — Progress Notes (Signed)
   Subjective:    Patient ID: Ethan Harris, male    DOB: 03/31/1986, 28 y.o.   MRN: [id number]  DOS:  11/10/2014 Type of visit - description : rov Interval history: Anxiety, good compliance with zoloft, takes Xanax up to 4 times a day, on average 3 times a day. Not getting excessively sleepy. Insomnia, lately is doing great, very seldom requires trazodone. History of high cholesterol, his New Year's resolution is to try to eat healthy.   ROS Denies chest pain or difficulty breathing. No nausea, vomiting, diarrhea  Past Medical History  Diagnosis Date  . Insomnia   . Anxiety and depression   . Allergic rhinitis   . Eczema   . 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 HYPERLIPIDEMIA 11/16/2010  . Hypercholesteremia   . Urolithiasis 07/22/2013    Past Surgical History  Procedure Laterality Date  . No past surgeries      History   Social History  . Marital Status: Single    Spouse Name: N/A    Number of Children: 0  . Years of Education: N/A   Occupational History  . entertainer     Social History Main Topics  . Smoking status: Former Smoker    Quit date: 04/13/2011  . Smokeless tobacco: Never Used  . Alcohol Use: Yes     Comment: Socially  . Drug Use: No  . Sexual Activity: Yes   Other Topics Concern  . Not on file   Social History Narrative   Lives w/ 2 room mates    (mom- Jennifer [place])    Denies being sex active at present          Medication List       This list is accurate as of: 11/10/14  4:59 PM.  Always use your most recent med list.               ALPRAZolam 0.5 MG tablet  Commonly known as:  XANAX  take 1 tablet by mouth three times a day if needed     sertraline 100 MG tablet  Commonly known as:  ZOLOFT  Take 2 tablets (200 mg total) by mouth daily.     traZODone 50 MG tablet  Commonly known as:  DESYREL  take 1 tablet by mouth at bedtime if needed for  sleep           Objective:   Physical Exam BP 121/79 mmHg  Pulse 77  Temp(Src) 98.1 F (36.7 C) (Oral)  Ht 5\' 6"  (1.676 m)  Wt 186 lb 4 oz (84.482 kg)  BMI 30.08 kg/m2  SpO2 93% General -- alert, well-developed, NAD.  Lungs -- normal respiratory effort, no intercostal retractions, no accessory muscle use, and normal breath sounds.  Heart-- normal rate, regular rhythm, no murmur.   Extremities-- no pretibial edema bilaterally  Neurologic--  alert & oriented X3. Speech normal, gait appropriate for age, strength symmetric and appropriate for age.  Psych-- Cognition and judgment appear intact. Cooperative with normal attention span and concentration. No anxious or depressed appearing.        Assessment & Plan:

## 2014-12-02 ENCOUNTER — Telehealth: Payer: Self-pay

## 2014-12-02 NOTE — Telephone Encounter (Signed)
UDS: 11/10/2014   Negative for Alprazolam: PRN Positive for Clonazepam??  -On Xanax, Sertraline, Desyrel -Not gotten enough Rx for daily usage thus explains why Xanax is negative -Taking Clonazepam not Rx per Dr. Drue NovelPaz   Moderate risk per Dr. Drue NovelPaz 12/02/2014

## 2014-12-21 ENCOUNTER — Telehealth: Payer: Self-pay | Admitting: Internal Medicine

## 2014-12-21 NOTE — Telephone Encounter (Signed)
Faxed to [facility].  

## 2014-12-21 NOTE — Telephone Encounter (Signed)
done

## 2014-12-21 NOTE — Telephone Encounter (Signed)
Pt is requesting refill on Alprazolam.  Last OV: 11/09/2014 Last Fill: 11/09/2014 # 90 0RF UDS: 11/09/2014 Moderate risk  Please advise.

## 2015-03-17 ENCOUNTER — Other Ambulatory Visit: Payer: Self-pay | Admitting: Internal Medicine

## 2015-03-18 NOTE — Telephone Encounter (Signed)
Rx printed, awaiting MD signature.  

## 2015-03-18 NOTE — Telephone Encounter (Signed)
Pt is requesting Alprazolam.  Last OV: 11/10/2014 Last Fill: 12/21/2014 #90 1RF UDS: 11/10/2014 Moderate risk  Please advise.

## 2015-03-18 NOTE — Telephone Encounter (Signed)
Faxed to [facility]

## 2015-03-18 NOTE — Telephone Encounter (Signed)
Addendum: Per Dr. Drue NovelPaz okay to wait on UDS until CPE next month (04/2015).

## 2015-03-18 NOTE — Telephone Encounter (Signed)
Okay 90 and 0 RF , UDS today or next week

## 2015-05-05 ENCOUNTER — Other Ambulatory Visit: Payer: Self-pay | Admitting: Internal Medicine

## 2015-05-05 NOTE — Telephone Encounter (Signed)
Per Dr. Drue Novel, since Pt has CPE scheduled for 05/27/2015, okay to wait to get UDS at appt.

## 2015-05-05 NOTE — Telephone Encounter (Signed)
Pt is requesting refill on Alprazolam.  Last OV: 11/10/2014, CPE scheduled for 05/27/2015 at 3:30PM Last Fill: 03/18/2015 #90 0RF UDS: 11/10/2014 Moderate risk   Please advise.

## 2015-05-05 NOTE — Telephone Encounter (Signed)
Rx printed, awaiting MD signature.  

## 2015-05-05 NOTE — Telephone Encounter (Signed)
Ok 90, no RF but needs to pick up rx and provide a UDS

## 2015-05-05 NOTE — Telephone Encounter (Signed)
Rx faxed to [facility].  

## 2015-05-06 ENCOUNTER — Telehealth: Payer: Self-pay | Admitting: Internal Medicine

## 2015-05-06 NOTE — Telephone Encounter (Signed)
Pre visit letter mailed 05/06/15  °

## 2015-05-26 ENCOUNTER — Encounter: Payer: Self-pay | Admitting: *Deleted

## 2015-05-26 ENCOUNTER — Telehealth: Payer: Self-pay | Admitting: *Deleted

## 2015-05-26 NOTE — Telephone Encounter (Signed)
Pre-Visit Call completed with patient and chart updated.   Pre-Visit Info documented in Specialty Comments under [employer].    

## 2015-05-27 ENCOUNTER — Ambulatory Visit (INDEPENDENT_AMBULATORY_CARE_PROVIDER_SITE_OTHER): Payer: Self-pay | Admitting: Internal Medicine

## 2015-05-27 ENCOUNTER — Encounter: Payer: Self-pay | Admitting: Internal Medicine

## 2015-05-27 VITALS — BP 128/74 | HR 67 | Temp 97.9°F | Ht 66.0 in | Wt 180.1 lb

## 2015-05-27 DIAGNOSIS — F411 Generalized anxiety disorder: Secondary | ICD-10-CM

## 2015-05-27 DIAGNOSIS — Z0000 Encounter for general adult medical examination without abnormal findings: Secondary | ICD-10-CM

## 2015-05-27 LAB — LIPID PANEL
Cholesterol: 236 mg/dL — ABNORMAL HIGH (ref 0–200)
HDL: 32 mg/dL — ABNORMAL LOW (ref >=40)
LDL Cholesterol: 168 mg/dL — ABNORMAL HIGH (ref 0–99)
Total CHOL/HDL Ratio: 7.4 Ratio
Triglycerides: 182 mg/dL — ABNORMAL HIGH (ref <150)
VLDL: 36 mg/dL (ref 0–40)

## 2015-05-27 LAB — COMPREHENSIVE METABOLIC PANEL
ALT: 43 U/L (ref 0–53)
AST: 22 U/L (ref 0–37)
Albumin: 4.3 g/dL (ref 3.5–5.2)
Alkaline Phosphatase: 69 U/L (ref 39–117)
BUN: 12 mg/dL (ref 6–23)
CO2: 27 mEq/L (ref 19–32)
CREATININE: 0.72 mg/dL (ref 0.50–1.35)
Calcium: 9.4 mg/dL (ref 8.4–10.5)
Chloride: 102 mEq/L (ref 96–112)
Glucose, Bld: 86 mg/dL (ref 70–99)
Potassium: 4.5 mEq/L (ref 3.5–5.3)
SODIUM: 140 meq/L (ref 135–145)
TOTAL PROTEIN: 7.2 g/dL (ref 6.0–8.3)
Total Bilirubin: 0.7 mg/dL (ref 0.2–1.2)

## 2015-05-27 NOTE — Patient Instructions (Signed)
Please go to the lab for blood work and also to provide a urine sample for a UDS   Safe Sex Safe sex is about reducing the risk of giving or getting a sexually transmitted disease (STD). STDs are spread through sexual contact involving the genitals, mouth, or rectum. Some STDs can be cured and others cannot. Safe sex can also prevent unintended pregnancies.  WHAT ARE SOME SAFE SEX PRACTICES?  Limit your sexual activity to only one partner who is having sex with only you.  Talk to your partner about his or her past partners, past STDs, and drug use.  Use a condom every time you have sexual intercourse. This includes vaginal, oral, and anal sexual activity. Both females and males should wear condoms during oral sex. Only use latex or polyurethane condoms and water-based lubricants. Using petroleum-based lubricants or oils to lubricate a condom will weaken the condom and increase the chance that it will break. The condom should be in place from the beginning to the end of sexual activity. Wearing a condom reduces, but does not completely eliminate, your risk of getting or giving an STD. STDs can be spread by contact with infected body fluids and skin.  Get vaccinated for hepatitis B and HPV.  Avoid alcohol and recreational drugs, which can affect your judgment. You may forget to use a condom or participate in high-risk sex.  For females, avoid douching after sexual intercourse. Douching can spread an infection farther into the reproductive tract.  Check your body for signs of sores, blisters, rashes, or unusual discharge. See your health care provider if you notice any of these signs.  Avoid sexual contact if you have symptoms of an infection or are being treated for an STD. If you or your partner has herpes, avoid sexual contact when blisters are present. Use condoms at all other times.  If you are at risk of being infected with HIV, it is recommended that you take a prescription medicine daily  to prevent HIV infection. This is called pre-exposure prophylaxis (PrEP). You are considered at risk if:  You are a man who has sex with other men (MSM).  You are a heterosexual man or woman who is sexually active with more than one partner.  You take drugs by injection.  You are sexually active with a partner who has HIV.  Talk with your health care provider about whether you are at high risk of being infected with HIV. If you choose to begin PrEP, you should first be tested for HIV. You should then be tested every 3 months for as long as you are taking PrEP.  See your health care provider for regular screenings, exams, and tests for other STDs. Before having sex with a new partner, each of you should be screened for STDs and should talk about the results with each other. WHAT ARE THE BENEFITS OF SAFE SEX?   There is less chance of getting or giving an STD.  You can prevent unwanted or unintended pregnancies.  By discussing safe sex concerns with your partner, you may increase feelings of intimacy, comfort, trust, and honesty between the two of you. Document Released: 12/06/2004 Document Revised: 03/15/2014 Document Reviewed: 04/21/2012 [facility] Patient Information 2015 [place], [place]. This information is not intended to replace advice given to you by your health care provider. Make sure you discuss any questions you have with your health care provider.   Testicular Self-Exam A self-examination of your testicles involves looking at and feeling your testicles  for abnormal lumps or swelling. Several things can cause swelling, lumps, or pain in your testicles. Some of these causes are:  Injuries.  Inflammation.  Infection.  Accumulation of fluids around your testicle (hydrocele).  Twisted testicles (testicular torsion).  Testicular cancer. Self-examination of the testicles and groin areas may be advised if you are at risk for testicular cancer. Risks for testicular cancer  include:  An undescended testicle (cryptorchidism).  A history of previous testicular cancer.  A family history of testicular cancer. The testicles are easiest to examine after warm baths or showers and are more difficult to examine when you are cold. This is because the muscles attached to the testicles retract and pull them up higher or into the abdomen. Follow these steps while you are standing:  Hold your penis away from your body.  Roll one testicle between your thumb and forefinger, feeling the entire testicle.  Roll the other testicle between your thumb and forefinger, feeling the entire testicle. Feel for lumps, swelling, or discomfort. A normal testicle is egg shaped and feels firm. It is smooth and not tender. The spermatic cord can be felt as a firm spaghetti-like cord at the back of your testicle. It is also important to examine the crease between the front of your leg and your abdomen. Feel for any bumps that are tender. These could be enlarged lymph nodes.  Document Released: 02/04/2001 Document Revised: 07/01/2013 Document Reviewed: 04/20/2013 [facility] Patient Information 2015 [place], [place]. This information is not intended to replace advice given to you by your health care provider. Make sure you discuss any questions you have with your health care provider.

## 2015-05-27 NOTE — Assessment & Plan Note (Signed)
Td 2013 counseled about exercise, diet, STE    Pt is transgender, rarely sexually active ---> safe sex ! RTC 6 months  labs

## 2015-05-27 NOTE — Progress Notes (Signed)
Pre visit review using our clinic review tool, if applicable. No additional management support is needed unless otherwise documented below in the visit note. 

## 2015-05-27 NOTE — Assessment & Plan Note (Signed)
Patient was taking sertraline 200 mg daily, was not feeling himself, had excessive sweats, he self decreased dose to 100 mg and feels better, anxiety remains well controlled. Also, continue taking Xanax as needed, usually twice a day Takes trazodone very rarely. Last UDS show clonazepam, he thinks is because he took some clonazepam from his mother while waiting for a xanax refill. Plan: Refill medications as needed, continue sertraline 100 mg, check a UDS.

## 2015-05-27 NOTE — Progress Notes (Signed)
Subjective:    Patient ID: Ethan Harris, male    DOB: 02-Nov-1986, 29 y.o.   MRN: [id number]  DOS:  05/27/2015 Type of visit - description : Complete physical exam Interval history: In general feeling well, did have some concerns, see review of systems   Review of Systems Constitutional: No fever. No chills. No unexplained wt changes. Had some unusual sweats, better since he decreased SSRIs  HEENT: No dental problems, no ear discharge, no facial swelling, no voice changes. No eye discharge, no eye  redness , no  intolerance to light   Respiratory: No wheezing , no  difficulty breathing. No cough , no mucus production  Cardiovascular: No CP, no leg swelling , no  Palpitations  GI: no nausea, no vomiting, no diarrhea , no  abdominal pain.  No blood in the stools. No dysphagia, no odynophagia    Endocrine: No polyphagia, no polyuria , no polydipsia  GU: No dysuria, gross hematuria, difficulty urinating. No urinary urgency, no frequency.  Musculoskeletal: No joint swellings or unusual aches or pains  Skin: No change in the color of the skin, palor , no  Rash  Allergic, immunologic: No environmental allergies , no  food allergies  Neurological: No dizziness no  syncope. No headaches. No diplopia, no slurred, no slurred speech, no motor deficits, no facial  Numbness  Hematological: No enlarged lymph nodes, no easy bruising , no unusual bleedings  Psychiatry: No suicidal ideas, no hallucinations, no beavior problems, no confusion.  Was not feeling well with high doses of SSRIs, self decreased dose to 100 mg of sertraline. Feeling better.   Past Medical History  Diagnosis Date  . Insomnia   . Anxiety and depression   . Allergic rhinitis   . Eczema   . 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 HYPERLIPIDEMIA 11/16/2010  . Hypercholesteremia   . Urolithiasis 07/22/2013    Past Surgical History    Procedure Laterality Date  . No past surgeries      History   Social History  . Marital Status: Single    Spouse Name: N/A  . Number of Children: 0  . Years of Education: N/A   Occupational History  . entertainer     Social History Main Topics  . Smoking status: Former Smoker    Quit date: 04/13/2011  . Smokeless tobacco: Never Used  . Alcohol Use: Yes     Comment: Socially  . Drug Use: No  . Sexual Activity: Yes   Other Topics Concern  . Not on file   Social History Narrative   Lives w/ 2 room mates    (mom- Jennifer [place])          Family History  Problem Relation Age of Onset  . Colon cancer Neg Hx   . Prostate cancer Neg Hx   . Diabetes Neg Hx   . Heart attack Neg Hx        Medication List       This list is accurate as of: 05/27/15 11:59 PM.  Always use your most recent med list.               ALPRAZolam 0.5 MG tablet  Commonly known as:  XANAX  Take 1 tablet (0.5 mg total) by mouth 3 (three) times daily as needed.     sertraline 100 MG tablet  Commonly known as:  ZOLOFT  Take 100 mg  by mouth daily.     traZODone 50 MG tablet  Commonly known as:  DESYREL  take 1 tablet by mouth at bedtime if needed for sleep           Objective:   Physical Exam BP 128/74 mmHg  Pulse 67  Temp(Src) 97.9 F (36.6 C) (Oral)  Ht  (1.676 m)  Wt 180 lb 2 oz (81.704 kg)  BMI 29.09 kg/m2  SpO2 98% General:   Well developed, well nourished . NAD.  Neck:  Full range of motion. Supple. No  Thyromegaly  HEENT:  Normocephalic . Face symmetric, atraumatic. Lungs:  CTA B Normal respiratory effort, no intercostal retractions, no accessory muscle use. Heart: RRR,  no murmur.  No pretibial edema bilaterally  Abdomen:  Not distended, soft, non-tender. No rebound or rigidity. No mass,organomegaly Skin: Exposed areas without rash. Not pale. Not jaundice Neurologic:  alert & oriented X3.  Speech normal, gait appropriate for age and  unassisted Strength symmetric and appropriate for age.  Psych: Cognition and judgment appear intact.  Cooperative with normal attention span and concentration.  Behavior appropriate. No anxious or depressed appearing.     Assessment & Plan:

## 2015-05-28 LAB — RPR

## 2015-05-28 LAB — HIV ANTIBODY (ROUTINE TESTING W REFLEX): HIV: NONREACTIVE

## 2015-06-13 ENCOUNTER — Telehealth: Payer: Self-pay

## 2015-06-13 NOTE — Telephone Encounter (Signed)
UDS: 05/27/2015   Positive for Alprazolam Positive for Sertraline   Low risk per Dr. Drue Novel 06/13/2015

## 2015-06-21 ENCOUNTER — Other Ambulatory Visit: Payer: Self-pay | Admitting: Internal Medicine

## 2015-06-21 NOTE — Telephone Encounter (Signed)
Okay #90 and 1 refill 

## 2015-06-21 NOTE — Telephone Encounter (Signed)
Rx printed, awaiting MD signature.  

## 2015-06-21 NOTE — Telephone Encounter (Signed)
Rx faxed to [facility].  

## 2015-06-21 NOTE — Telephone Encounter (Signed)
Pt is requesting refill on Alprazolam.   Last OV: 05/27/2015 Last Fill: 05/05/2015 #90 0RF UDS: 05/27/2015 Low risk  Please advise.

## 2015-09-26 ENCOUNTER — Other Ambulatory Visit: Payer: Self-pay | Admitting: Internal Medicine

## 2015-09-27 ENCOUNTER — Telehealth: Payer: Self-pay | Admitting: Internal Medicine

## 2015-09-27 NOTE — Telephone Encounter (Signed)
I have faxed to [phone] four times, received fax failed everytime. Will try refaxing.

## 2015-09-27 NOTE — Telephone Encounter (Signed)
[facility] [place]

## 2015-09-27 NOTE — Telephone Encounter (Signed)
Okay 90 and one refill 

## 2015-09-27 NOTE — Telephone Encounter (Signed)
Relation to WU:JWJXpt:self Call back number:[phone] Pharmacy: [facility] - [place], [place] - [address] [phone] (Phone) [phone] (Fax)         Reason for call:  Patient spoke with pharmacy a few minutes ago and pharmacy states Xanax was denied and patient seeking a refill for ALPRAZolam (XANAX) 0.5 MG tablet and traZODone (DESYREL) 50 MG tablet. Patient requesting you call pharmacy directly. Please advise patient directly

## 2015-09-27 NOTE — Telephone Encounter (Signed)
[facility] [place] Awaiting fax confirmation.

## 2015-09-27 NOTE — Telephone Encounter (Signed)
[facility] [place] confirmed their phone # and fax # Ph# [phone] Fax# [phone]  She states they did not receive the RX faxed in this morning at 10:57am. She is requesting refax.

## 2015-09-27 NOTE — Telephone Encounter (Signed)
Pt is requesting refill on Alprazolam.  Last OV: 05/27/2015, appt scheduled on 11/29/2015 at 1:15 Last Fill: 06/21/2015 #90 and 1RF Pt can take 1 tablet TID PRN UDS: 05/27/2015 Low risk   Please advise.

## 2015-09-27 NOTE — Telephone Encounter (Signed)
Fax failed again.

## 2015-09-27 NOTE — Telephone Encounter (Signed)
Rx printed, awaiting MD signature.  

## 2015-09-27 NOTE — Telephone Encounter (Signed)
Faxed again to [phone]. Received fax confirmation on 09/27/2015 at 3:27 PM.

## 2015-11-08 ENCOUNTER — Other Ambulatory Visit: Payer: Self-pay

## 2015-11-29 ENCOUNTER — Ambulatory Visit: Payer: Self-pay | Admitting: Internal Medicine

## 2015-12-07 ENCOUNTER — Ambulatory Visit (INDEPENDENT_AMBULATORY_CARE_PROVIDER_SITE_OTHER): Payer: Self-pay | Admitting: Internal Medicine

## 2015-12-07 ENCOUNTER — Encounter: Payer: Self-pay | Admitting: Internal Medicine

## 2015-12-07 VITALS — BP 120/84 | HR 76 | Temp 98.1°F | Resp 14 | Ht 66.0 in | Wt 181.0 lb

## 2015-12-07 DIAGNOSIS — F640 Transsexualism: Secondary | ICD-10-CM

## 2015-12-07 DIAGNOSIS — Z789 Other specified health status: Secondary | ICD-10-CM

## 2015-12-07 DIAGNOSIS — F411 Generalized anxiety disorder: Secondary | ICD-10-CM

## 2015-12-07 DIAGNOSIS — G4700 Insomnia, unspecified: Secondary | ICD-10-CM

## 2015-12-07 MED ORDER — ALPRAZOLAM 0.5 MG PO TABS: 0.5000 mg | ORAL_TABLET | Freq: Three times a day (TID) | ORAL | Status: DC | PRN

## 2015-12-07 NOTE — Progress Notes (Signed)
Pre visit review using our clinic review tool, if applicable. No additional management support is needed unless otherwise documented below in the visit note. 

## 2015-12-07 NOTE — Progress Notes (Addendum)
   Subjective:    Patient ID: Ethan Harris, male    DOB: 1986/03/14, 30 y.o.   MRN: [id number]  DOS:  12/07/2015 Type of visit - description : Routine checkup Interval history: Good compliance w/ medications, no apparent side effects. To start HRT soon.    Review of Systems  No chestpain,difficulty breathing. No nausea,vomiting,diarrhea.  Past Medical History  Diagnosis Date  . Insomnia   . Anxiety and depression   . Allergic rhinitis   . Eczema   . 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 HYPERLIPIDEMIA 11/16/2010  . Hypercholesteremia   . Urolithiasis 07/22/2013    Past Surgical History  Procedure Laterality Date  . No past surgeries      Social History   Social History  . Marital Status: Single    Spouse Name: N/A  . Number of Children: 0  . Years of Education: N/A   Occupational History  . entertainer     Social History Main Topics  . Smoking status: Former Smoker    Quit date: 04/13/2011  . Smokeless tobacco: Never Used  . Alcohol Use: Yes     Comment: Socially  . Drug Use: No  . Sexual Activity: Yes   Other Topics Concern  . Not on file   Social History Narrative   Lives w/ 2 room mates    (mom- Jennifer [place])             Medication List       This list is accurate as of: 12/07/15 11:59 PM.  Always use your most recent med list.               ALPRAZolam 0.5 MG tablet  Commonly known as:  XANAX  Take 1 tablet (0.5 mg total) by mouth 3 (three) times daily as needed for anxiety.     sertraline 100 MG tablet  Commonly known as:  ZOLOFT  Take 100 mg by mouth daily.     traZODone 50 MG tablet  Commonly known as:  DESYREL  take 1 tablet by mouth at bedtime if needed for sleep           Objective:   Physical Exam BP 120/84 mmHg  Pulse 76  Temp(Src) 98.1 F (36.7 C) (Oral)  Resp 14  Ht  (1.676 m)  Wt 181 lb (82.101 kg)  BMI 29.23 kg/m2 General:     Well developed, well nourished . NAD.  HEENT:  Normocephalic . Face symmetric, atraumatic Neurologic:  alert & oriented X3.  Speech normal, gait appropriate for age and unassisted Psych--  Cognition and judgment appear intact.  Cooperative with normal attention span and concentration.  Behavior appropriate. No anxious or depressed appearing. 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 Will refill Xanax today, get a UDS. Call as needed for refills. Transgender: to start HRT under the supervision of another M.D. Primary care-- flu shot, declined RTC 6 months, CPX

## 2015-12-07 NOTE — Patient Instructions (Signed)
BEFORE YOU LEAVE THE OFFICE: GO TO THE LAB , provide a urine sample for a UDS  **GO TO THE FRONT DESK Schedule a complete physical exam to be done in 6 months  Please be fasting

## 2015-12-08 DIAGNOSIS — Z789 Other specified health status: Secondary | ICD-10-CM | POA: Insufficient documentation

## 2015-12-08 DIAGNOSIS — F640 Transsexualism: Secondary | ICD-10-CM | POA: Insufficient documentation

## 2015-12-16 ENCOUNTER — Telehealth: Payer: Self-pay

## 2015-12-16 NOTE — Telephone Encounter (Signed)
UDS: 12/07/2015  Positive for Alprazolam Sertraline not detected, no substance of abuse: OK   Low risk per Dr. Drue Novel 12/16/2015

## 2016-01-16 ENCOUNTER — Encounter: Payer: Self-pay | Admitting: Internal Medicine

## 2016-01-18 ENCOUNTER — Encounter (HOSPITAL_COMMUNITY): Payer: Self-pay | Admitting: Emergency Medicine

## 2016-01-18 ENCOUNTER — Emergency Department (HOSPITAL_COMMUNITY): Payer: Self-pay

## 2016-01-18 DIAGNOSIS — R079 Chest pain, unspecified: Secondary | ICD-10-CM | POA: Insufficient documentation

## 2016-01-18 DIAGNOSIS — F329 Major depressive disorder, single episode, unspecified: Secondary | ICD-10-CM | POA: Insufficient documentation

## 2016-01-18 DIAGNOSIS — Z87891 Personal history of nicotine dependence: Secondary | ICD-10-CM | POA: Insufficient documentation

## 2016-01-18 DIAGNOSIS — F419 Anxiety disorder, unspecified: Secondary | ICD-10-CM | POA: Insufficient documentation

## 2016-01-18 DIAGNOSIS — E785 Hyperlipidemia, unspecified: Secondary | ICD-10-CM | POA: Insufficient documentation

## 2016-01-18 DIAGNOSIS — Z79899 Other long term (current) drug therapy: Secondary | ICD-10-CM | POA: Insufficient documentation

## 2016-01-18 DIAGNOSIS — Z872 Personal history of diseases of the skin and subcutaneous tissue: Secondary | ICD-10-CM | POA: Insufficient documentation

## 2016-01-18 LAB — CBC
HCT: 38.9 % — ABNORMAL LOW (ref 39.0–52.0)
Hemoglobin: 13.8 g/dL (ref 13.0–17.0)
MCH: 30.4 pg (ref 26.0–34.0)
MCHC: 35.5 g/dL (ref 30.0–36.0)
MCV: 85.7 fL (ref 78.0–100.0)
PLATELETS: 293 10*3/uL (ref 150–400)
RBC: 4.54 MIL/uL (ref 4.22–5.81)
RDW: 12.6 % (ref 11.5–15.5)
WBC: 7.8 10*3/uL (ref 4.0–10.5)

## 2016-01-18 LAB — BASIC METABOLIC PANEL
Anion gap: 9 (ref 5–15)
BUN: 10 mg/dL (ref 6–20)
CO2: 25 mmol/L (ref 22–32)
CREATININE: 0.67 mg/dL (ref 0.61–1.24)
Calcium: 9.4 mg/dL (ref 8.9–10.3)
Chloride: 104 mmol/L (ref 101–111)
Glucose, Bld: 117 mg/dL — ABNORMAL HIGH (ref 65–99)
Potassium: 4.4 mmol/L (ref 3.5–5.1)
SODIUM: 138 mmol/L (ref 135–145)

## 2016-01-18 LAB — I-STAT TROPONIN, ED: TROPONIN I, POC: 0 ng/mL (ref 0.00–0.08)

## 2016-01-18 NOTE — ED Notes (Signed)
Pt c/o L sided chest discomfort occasionally radiating down L arm x 3 days. Pt started hormone therapy Jan 30th.

## 2016-01-19 ENCOUNTER — Emergency Department (HOSPITAL_COMMUNITY)
Admission: EM | Admit: 2016-01-19 | Discharge: 2016-01-19 | Disposition: A | Discharge status: Home / Self Care | Payer: Self-pay | Attending: Emergency Medicine | Admitting: Emergency Medicine

## 2016-01-19 DIAGNOSIS — R079 Chest pain, unspecified: Secondary | ICD-10-CM

## 2016-01-19 LAB — D-DIMER, QUANTITATIVE (NOT AT ARMC): D DIMER QUANT: 0.27 ug{FEU}/mL (ref 0.00–0.50)

## 2016-01-19 MED ORDER — NAPROXEN 500 MG PO TABS: 500.0000 mg | ORAL_TABLET | Freq: Two times a day (BID) | ORAL | Status: DC

## 2016-01-19 NOTE — ED Provider Notes (Signed)
CSN: [id number]     Arrival date & time 01/18/16  2155 History   By signing my name below, I, Arlan Organ, attest that this documentation has been prepared under the direction and in the presence of Gilda Crease, MD.  Electronically Signed: Arlan Organ, ED Scribe. 01/19/2016. 2:58 AM.   Chief Complaint  Patient presents with  . Chest Pain   The history is provided by the patient. No language interpreter was used.    HPI Comments: Ethan Harris is a 30 y.o. male with a PMHx of hyperlipidemia who presents to the Emergency Department complaining of intermittent, ongoing L sided chest pain that intermittently radiates to the L arm x 3 days. Pain is described as sharp and dull. No aggravating or alleviating factors at this time. No OTC medications or home remedies attempted prior to arrival. No recent fever, chills, nausea, vomiting, diarrhea, abdominal pain, or shortness of breath. Pt recently started hormone therapy on January 30 consisting of 200 mg of Spirolactone and 40 mg of Estradiol daily. Pt denies any issues since starting medications. No known allergies to medications.  PCP: Willow Ora, MD    Past Medical History  Diagnosis Date  . Insomnia   . Anxiety and depression   . Allergic rhinitis   . Eczema   . 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 HYPERLIPIDEMIA 11/16/2010  . Hypercholesteremia   . Urolithiasis 07/22/2013   Past Surgical History  Procedure Laterality Date  . No past surgeries     Family History  Problem Relation Age of Onset  . Colon cancer Neg Hx   . Prostate cancer Neg Hx   . Diabetes Neg Hx   . Heart attack Neg Hx    Social History  Substance Use Topics  . Smoking status: Former Smoker    Quit date: 04/13/2011  . Smokeless tobacco: Never Used  . Alcohol Use: Yes     Comment: Socially    Review of Systems  Constitutional: Negative for fever and chills.  Respiratory:  Negative for cough and shortness of breath.   Cardiovascular: Positive for chest pain.  Gastrointestinal: Negative for nausea, vomiting and abdominal pain.  Neurological: Negative for headaches.  Psychiatric/Behavioral: Negative for confusion.  All other systems reviewed and are negative.     Allergies  Review of patient's allergies indicates no known allergies.  Home Medications   Prior to Admission medications   Medication Sig Start Date End Date Taking? Authorizing Provider  ALPRAZolam Prudy Feeler) 0.5 MG tablet Take 1 tablet (0.5 mg total) by mouth 3 (three) times daily as needed for anxiety. 12/07/15  Yes Wanda Plump, MD  estradiol (ESTRACE) 2 MG tablet Take 2 mg by mouth 2 (two) times daily.   Yes Historical Provider, MD  spironolactone (ALDACTONE) 100 MG tablet Take 200 mg by mouth daily.   Yes Historical Provider, MD  naproxen (NAPROSYN) 500 MG tablet Take 1 tablet (500 mg total) by mouth 2 (two) times daily. 01/19/16   Gilda Crease, MD  traZODone (DESYREL) 50 MG tablet take 1 tablet by mouth at bedtime if needed for sleep Patient not taking: Reported on 01/19/2016    Wanda Plump, MD   Triage Vitals: BP 128/84 mmHg  Pulse 59  Temp(Src) 97.9 F (36.6 C) (Oral)  Resp 22  Ht  (1.651 m)  Wt 179 lb 9 oz (81.449 kg)  BMI 29.88 kg/m2  SpO2  86%   Physical Exam  Constitutional: He is oriented to person, place, and time. He appears well-developed and well-nourished. No distress.  HENT:  Head: Normocephalic and atraumatic.  Right Ear: Hearing normal.  Left Ear: Hearing normal.  Nose: Nose normal.  Mouth/Throat: Oropharynx is clear and moist and mucous membranes are normal.  Eyes: Conjunctivae and EOM are normal. Pupils are equal, round, and reactive to light.  Neck: Normal range of motion. Neck supple.  Cardiovascular: Regular rhythm, S1 normal and S2 normal.  Exam reveals no gallop and no friction rub.   No murmur heard. Pulmonary/Chest: Effort normal and breath sounds  normal. No respiratory distress. He exhibits no tenderness.  Abdominal: Soft. Normal appearance and bowel sounds are normal. There is no hepatosplenomegaly. There is no tenderness. There is no rebound, no guarding, no tenderness at McBurney's point and negative Murphy's sign. No hernia.  Musculoskeletal: Normal range of motion.  Neurological: He is alert and oriented to person, place, and time. He has normal strength. No cranial nerve deficit or sensory deficit. Coordination normal. GCS eye subscore is 4. GCS verbal subscore is 5. GCS motor subscore is 6.  Skin: Skin is warm, dry and intact. No rash noted. No cyanosis.  Psychiatric: He has a normal mood and affect. His speech is normal and behavior is normal. Thought content normal.  Nursing note and vitals reviewed.   ED Course  Procedures (including critical care time)  DIAGNOSTIC STUDIES: Oxygen Saturation is 100% on RA, Normal by my interpretation.    COORDINATION OF CARE: 2:57 AM- Will order CXR, blood work, and EKG. Discussed treatment plan with pt at bedside and pt agreed to plan.     Labs Review Labs Reviewed  BASIC METABOLIC PANEL - Abnormal; Notable for the following:    Glucose, Bld 117 (*)    All other components within normal limits  CBC - Abnormal; Notable for the following:    HCT 38.9 (*)    All other components within normal limits  D-DIMER, QUANTITATIVE (NOT AT [facility])  Rosezena SensorI-STAT TROPOININ, ED    Imaging Review Dg Chest 2 View  01/18/2016  CLINICAL DATA:  Initial evaluation for acute left chest pain. EXAM: CHEST  2 VIEW COMPARISON:  Prior radiograph from 07/20/2013. FINDINGS: The cardiac and mediastinal silhouettes are stable in size and contour, and remain within normal limits. The lungs are normally inflated. No airspace consolidation, pleural effusion, or pulmonary edema is identified. There is no pneumothorax. No acute osseous abnormality identified. IMPRESSION: No active cardiopulmonary disease. Electronically Signed    By: Rise MuBenjamin  McClintock M.D.   On: 01/18/2016 22:24   I have personally reviewed and evaluated these images and lab results as part of my medical decision-making.   EKG Interpretation   Date/Time:  Wednesday January 18 2016 22:03:43 EST Ventricular Rate:  96 PR Interval:  130 QRS Duration: 88 QT Interval:  336 QTC Calculation: 424 R Axis:   73 Text Interpretation:  Normal sinus rhythm Normal ECG Confirmed by POLLINA   MD, CHRISTOPHER ([id number]) on 01/19/2016 2:40:34 AM      MDM   Final diagnoses:  Chest pain, unspecified chest pain type    Patient presents to the ER for evaluation of chest pain. Pain is atypical in nature. Pain occurs intermittently and does not last very long. It is not related to exertion. Patient has very low cardiac risk, felt to be very low risk for cardiac etiology. EKG unremarkable. Troponin negative 2. This is felt to be adequate to  rule out cardiac etiology. Patient does not have any history or physiology that suggests PE. He is not tachycardic. There is no hypoxia. Because the patient is on estrogen, however, PE is felt to be a possibility. The negative d-dimer, combined with PERC is felt to be adequate to rule out PE. Patient reassured, will treat with anti-inflammatory, follow-up with primary doctor.  I personally performed the services described in this documentation, which was scribed in my presence. The recorded information has been reviewed and is accurate.    Gilda Crease, MD 01/19/16 [phone]

## 2016-01-19 NOTE — Discharge Instructions (Signed)
Nonspecific Chest Pain  °Chest pain can be caused by many different conditions. There is always a chance that your pain could be related to something serious, such as a heart attack or a blood clot in your lungs. Chest pain can also be caused by conditions that are not life-threatening. If you have chest pain, it is very important to follow up with your health care provider. °CAUSES  °Chest pain can be caused by: °· Heartburn. °· Pneumonia or bronchitis. °· Anxiety or stress. °· Inflammation around your heart (pericarditis) or lung (pleuritis or pleurisy). °· A blood clot in your lung. °· A collapsed lung (pneumothorax). It can develop suddenly on its own (spontaneous pneumothorax) or from trauma to the chest. °· Shingles infection (varicella-zoster virus). °· Heart attack. °· Damage to the bones, muscles, and cartilage that make up your chest wall. This can include: °¨ Bruised bones due to injury. °¨ Strained muscles or cartilage due to frequent or repeated coughing or overwork. °¨ Fracture to one or more ribs. °¨ Sore cartilage due to inflammation (costochondritis). °RISK FACTORS  °Risk factors for chest pain may include: °· Activities that increase your risk for trauma or injury to your chest. °· Respiratory infections or conditions that cause frequent coughing. °· Medical conditions or overeating that can cause heartburn. °· Heart disease or family history of heart disease. °· Conditions or health behaviors that increase your risk of developing a blood clot. °· Having had chicken pox (varicella zoster). °SIGNS AND SYMPTOMS °Chest pain can feel like: °· Burning or tingling on the surface of your chest or deep in your chest. °· Crushing, pressure, aching, or squeezing pain. °· Dull or sharp pain that is worse when you move, cough, or take a deep breath. °· Pain that is also felt in your back, neck, shoulder, or arm, or pain that spreads to any of these areas. °Your chest pain may come and go, or it may stay  constant. °DIAGNOSIS °Lab tests or other studies may be needed to find the cause of your pain. Your health care provider may have you take a test called an ambulatory ECG (electrocardiogram). An ECG records your heartbeat patterns at the time the test is performed. You may also have other tests, such as: °· Transthoracic echocardiogram (TTE). During echocardiography, sound waves are used to create a picture of all of the heart structures and to look at how blood flows through your heart. °· Transesophageal echocardiogram (TEE). This is a more advanced imaging test that obtains images from inside your body. It allows your health care provider to see your heart in finer detail. °· Cardiac monitoring. This allows your health care provider to monitor your heart rate and rhythm in real time. °· Holter monitor. This is a portable device that records your heartbeat and can help to diagnose abnormal heartbeats. It allows your health care provider to track your heart activity for several days, if needed. °· Stress tests. These can be done through exercise or by taking medicine that makes your heart beat more quickly. °· Blood tests. °· Imaging tests. °TREATMENT  °Your treatment depends on what is causing your chest pain. Treatment may include: °· Medicines. These may include: °¨ Acid blockers for heartburn. °¨ Anti-inflammatory medicine. °¨ Pain medicine for inflammatory conditions. °¨ Antibiotic medicine, if an infection is present. °¨ Medicines to dissolve blood clots. °¨ Medicines to treat coronary artery disease. °· Supportive care for conditions that do not require medicines. This may include: °¨ Resting. °¨ Applying heat   or cold packs to injured areas. °¨ Limiting activities until pain decreases. °HOME CARE INSTRUCTIONS °· If you were prescribed an antibiotic medicine, finish it all even if you start to feel better. °· Avoid any activities that bring on chest pain. °· Do not use any tobacco products, including  cigarettes, chewing tobacco, or electronic cigarettes. If you need help quitting, ask your health care provider. °· Do not drink alcohol. °· Take medicines only as directed by your health care provider. °· Keep all follow-up visits as directed by your health care provider. This is important. This includes any further testing if your chest pain does not go away. °· If heartburn is the cause for your chest pain, you may be told to keep your head raised (elevated) while sleeping. This reduces the chance that acid will go from your stomach into your esophagus. °· Make lifestyle changes as directed by your health care provider. These may include: °¨ Getting regular exercise. Ask your health care provider to suggest some activities that are safe for you. °¨ Eating a heart-healthy diet. A registered dietitian can help you to learn healthy eating options. °¨ Maintaining a healthy weight. °¨ Managing diabetes, if necessary. °¨ Reducing stress. °SEEK MEDICAL CARE IF: °· Your chest pain does not go away after treatment. °· You have a rash with blisters on your chest. °· You have a fever. °SEEK IMMEDIATE MEDICAL CARE IF:  °· Your chest pain is worse. °· You have an increasing cough, or you cough up blood. °· You have severe abdominal pain. °· You have severe weakness. °· You faint. °· You have chills. °· You have sudden, unexplained chest discomfort. °· You have sudden, unexplained discomfort in your arms, back, neck, or jaw. °· You have shortness of breath at any time. °· You suddenly start to sweat, or your skin gets clammy. °· You feel nauseous or you vomit. °· You suddenly feel light-headed or dizzy. °· Your heart begins to beat quickly, or it feels like it is skipping beats. °These symptoms may represent a serious problem that is an emergency. Do not wait to see if the symptoms will go away. Get medical help right away. Call your local emergency services (911 in the [place] Do not drive yourself to the hospital. °  °This  information is not intended to replace advice given to you by your health care provider. Make sure you discuss any questions you have with your health care provider. °  °Document Released: 08/08/2005 Document Revised: 11/19/2014 Document Reviewed: 06/04/2014 °Elsevier Interactive Patient Education ©2016 [employer] ° °

## 2016-02-11 ENCOUNTER — Emergency Department (HOSPITAL_BASED_OUTPATIENT_CLINIC_OR_DEPARTMENT_OTHER)
Admission: EM | Admit: 2016-02-11 | Discharge: 2016-02-12 | Disposition: A | Discharge status: Home / Self Care | Payer: Self-pay | Attending: Emergency Medicine | Admitting: Emergency Medicine

## 2016-02-11 ENCOUNTER — Encounter (HOSPITAL_BASED_OUTPATIENT_CLINIC_OR_DEPARTMENT_OTHER): Payer: Self-pay | Admitting: Emergency Medicine

## 2016-02-11 DIAGNOSIS — Z79818 Long term (current) use of other agents affecting estrogen receptors and estrogen levels: Secondary | ICD-10-CM | POA: Insufficient documentation

## 2016-02-11 DIAGNOSIS — M79605 Pain in left leg: Secondary | ICD-10-CM | POA: Insufficient documentation

## 2016-02-11 DIAGNOSIS — F419 Anxiety disorder, unspecified: Secondary | ICD-10-CM | POA: Insufficient documentation

## 2016-02-11 DIAGNOSIS — Z79899 Other long term (current) drug therapy: Secondary | ICD-10-CM | POA: Insufficient documentation

## 2016-02-11 DIAGNOSIS — F329 Major depressive disorder, single episode, unspecified: Secondary | ICD-10-CM | POA: Insufficient documentation

## 2016-02-11 DIAGNOSIS — Z8709 Personal history of other diseases of the respiratory system: Secondary | ICD-10-CM | POA: Insufficient documentation

## 2016-02-11 DIAGNOSIS — Z87891 Personal history of nicotine dependence: Secondary | ICD-10-CM | POA: Insufficient documentation

## 2016-02-11 DIAGNOSIS — Z872 Personal history of diseases of the skin and subcutaneous tissue: Secondary | ICD-10-CM | POA: Insufficient documentation

## 2016-02-11 DIAGNOSIS — Z8659 Personal history of other mental and behavioral disorders: Secondary | ICD-10-CM | POA: Insufficient documentation

## 2016-02-11 DIAGNOSIS — Z87442 Personal history of urinary calculi: Secondary | ICD-10-CM | POA: Insufficient documentation

## 2016-02-11 DIAGNOSIS — Z8669 Personal history of other diseases of the nervous system and sense organs: Secondary | ICD-10-CM | POA: Insufficient documentation

## 2016-02-11 DIAGNOSIS — Z791 Long term (current) use of non-steroidal anti-inflammatories (NSAID): Secondary | ICD-10-CM | POA: Insufficient documentation

## 2016-02-11 NOTE — ED Provider Notes (Signed)
CSN: [id number]     Arrival date & time 02/11/16  2251 History  By signing my name below, I, Ethan Harris, attest that this documentation has been prepared under the direction and in the presence of Geoffery Lyons, MD. Electronically Signed: Evon Harris, ED Scribe. 02/11/2016. 11:31 PM.      Chief Complaint  Patient presents with  . Leg Pain   Patient is a 30 y.o. male presenting with leg pain. The history is provided by the patient. No language interpreter was used.  Leg Pain  HPI Comments: Ethan Harris is a 30 y.o. male who presents to the Emergency Department complaining of left lower leg pain onset 3 days prior. Pt describes the pain as intermittent cramping. Pt states that he is on a high dose estrogen daily. Pt denies any alleviating or worsening factors. Pt denies any new strenuous activities. Denies CP or SOB. Pt denies Hx of PE or DVT's.   Past Medical History  Diagnosis Date  . Insomnia   . Anxiety and depression   . Allergic rhinitis   . Eczema   . 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 HYPERLIPIDEMIA 11/16/2010  . Hypercholesteremia   . Urolithiasis 07/22/2013   Past Surgical History  Procedure Laterality Date  . No past surgeries     Family History  Problem Relation Age of Onset  . Colon cancer Neg Hx   . Prostate cancer Neg Hx   . Diabetes Neg Hx   . Heart attack Neg Hx    Social History  Substance Use Topics  . Smoking status: Former Smoker    Quit date: 04/13/2011  . Smokeless tobacco: Never Used  . Alcohol Use: Yes     Comment: Socially    Review of Systems  Respiratory: Negative for shortness of breath.   Cardiovascular: Negative for chest pain.  Musculoskeletal: Positive for myalgias.  All other systems reviewed and are negative.     Allergies  Review of patient's allergies indicates no known allergies.  Home Medications   Prior to Admission medications    Medication Sig Start Date End Date Taking? Authorizing Provider  ALPRAZolam Prudy Feeler) 0.5 MG tablet Take 1 tablet (0.5 mg total) by mouth 3 (three) times daily as needed for anxiety. 12/07/15   Wanda Plump, MD  estradiol (ESTRACE) 2 MG tablet Take 2 mg by mouth 2 (two) times daily.    Historical Provider, MD  naproxen (NAPROSYN) 500 MG tablet Take 1 tablet (500 mg total) by mouth 2 (two) times daily. 01/19/16   Gilda Crease, MD  spironolactone (ALDACTONE) 100 MG tablet Take 200 mg by mouth daily.    Historical Provider, MD  traZODone (DESYREL) 50 MG tablet take 1 tablet by mouth at bedtime if needed for sleep Patient not taking: Reported on 01/19/2016    Wanda Plump, MD   BP 147/106 mmHg  Pulse 110  Temp(Src) 99 F (37.2 C) (Oral)  Resp 18  Ht  (1.651 m)  Wt 180 lb (81.647 kg)  BMI 29.95 kg/m2  SpO2 96%   Physical Exam  Constitutional: He is oriented to person, place, and time. He appears well-developed and well-nourished. No distress.  HENT:  Head: Normocephalic and atraumatic.  Eyes: Conjunctivae and EOM are normal.  Neck: Neck supple. No tracheal deviation present.  Cardiovascular: Normal rate.   Pulmonary/Chest: Effort normal. No respiratory distress.  Musculoskeletal: Normal range of motion. He  exhibits no edema.  Left lower extremity appear grossly normal. There is tenderness to the left calf. No edema. DP pulses easily palpable. Homans sign absent.   Neurological: He is alert and oriented to person, place, and time.  Skin: Skin is warm and dry.  Psychiatric: He has a normal mood and affect. His behavior is normal.  Nursing note and vitals reviewed.   ED Course  Procedures (including critical care time) DIAGNOSTIC STUDIES: Oxygen Saturation is 96% on RA, normal by my interpretation.    COORDINATION OF CARE: 11:30 PM-Discussed treatment plan with pt at bedside and pt agreed to plan.     Labs Review Labs Reviewed - No data to display  Imaging Review No  results found.    MDM   Final diagnoses:  None   Patient presents with complaints of left leg pain and is concerned about the possibility of a blood clot as they're undergoing hormone therapy. The physical examination is basically normal and d-dimer is negative. I highly doubt a DVT and feel as though the negative d-dimer rules this out. Patient will be discharged to home. To return as needed for any problems.   I personally performed the services described in this documentation, which was scribed in my presence. The recorded information has been reviewed and is accurate.        Geoffery Lyonsouglas Veer Elamin, MD 02/12/16 [phone]

## 2016-02-11 NOTE — ED Notes (Signed)
EDP into room 

## 2016-02-11 NOTE — ED Notes (Signed)
Pt c/o LLE pain onset 4 days ago. Pt is on estrogen and was told that clots were a risk and to be seen for new onset leg pain. Ambulatory with steady gait.

## 2016-02-11 NOTE — ED Notes (Signed)
Pt ambulatory to room, steady gait, NAD, calm, interactive, no dyspnea noted.

## 2016-02-12 LAB — D-DIMER, QUANTITATIVE (NOT AT ARMC)

## 2016-02-12 NOTE — Discharge Instructions (Signed)
Ibuprofen 600 mg every 6 hours as needed for pain.  Return to the emergency department if your symptoms significantly worsen or change.   Musculoskeletal Pain Musculoskeletal pain is muscle and boney aches and pains. These pains can occur in any part of the body. Your caregiver may treat you without knowing the cause of the pain. They may treat you if blood or urine tests, X-rays, and other tests were normal.  CAUSES There is often not a definite cause or reason for these pains. These pains may be caused by a type of germ (virus). The discomfort may also come from overuse. Overuse includes working out too hard when your body is not fit. Boney aches also come from weather changes. Bone is sensitive to atmospheric pressure changes. HOME CARE INSTRUCTIONS   Ask when your test results will be ready. Make sure you get your test results.  Only take over-the-counter or prescription medicines for pain, discomfort, or fever as directed by your caregiver. If you were given medications for your condition, do not drive, operate machinery or power tools, or sign legal documents for 24 hours. Do not drink alcohol. Do not take sleeping pills or other medications that may interfere with treatment.  Continue all activities unless the activities cause more pain. When the pain lessens, slowly resume normal activities. Gradually increase the intensity and duration of the activities or exercise.  During periods of severe pain, bed rest may be helpful. Lay or sit in any position that is comfortable.  Putting ice on the injured area.  Put ice in a bag.  Place a towel between your skin and the bag.  Leave the ice on for 15 to 20 minutes, 3 to 4 times a day.  Follow up with your caregiver for continued problems and no reason can be found for the pain. If the pain becomes worse or does not go away, it may be necessary to repeat tests or do additional testing. Your caregiver may need to look further for a possible  cause. SEEK IMMEDIATE MEDICAL CARE IF:  You have pain that is getting worse and is not relieved by medications.  You develop chest pain that is associated with shortness or breath, sweating, feeling sick to your stomach (nauseous), or throw up (vomit).  Your pain becomes localized to the abdomen.  You develop any new symptoms that seem different or that concern you. MAKE SURE YOU:   Understand these instructions.  Will watch your condition.  Will get help right away if you are not doing well or get worse.   This information is not intended to replace advice given to you by your health care provider. Make sure you discuss any questions you have with your health care provider.   Document Released: 10/29/2005 Document Revised: 01/21/2012 Document Reviewed: 07/03/2013 Elsevier Interactive Patient Education [employer]2016 Elsevier Inc.

## 2016-02-14 ENCOUNTER — Telehealth: Payer: Self-pay | Admitting: Internal Medicine

## 2016-02-14 NOTE — Telephone Encounter (Signed)
Spoke w/ Pt, med list updated.

## 2016-02-14 NOTE — Telephone Encounter (Signed)
Caller name: Self  Can be reached: [phone]  Reason for call: Patient request call back to update the list of medications in his chart

## 2016-02-22 ENCOUNTER — Telehealth: Payer: Self-pay

## 2016-02-22 MED ORDER — TRAZODONE HCL 50 MG PO TABS: 50.0000 mg | ORAL_TABLET | Freq: Every evening | ORAL | Status: DC | PRN

## 2016-02-22 NOTE — Telephone Encounter (Signed)
[facility]. Pt has appt scheduled 06/05/2016.

## 2016-02-22 NOTE — Telephone Encounter (Signed)
Pt is requesting refill on Trazodone. No longer on med list.  Last OV: 12/07/2015 Last Fill: 05/06/2015 #30 and 4RF UDS: 12/07/2015 Low risk   (Pt seen at ED on 01/19/2016 for Chest pain, and again on 02/11/2016 for L leg pain)  Please advise.

## 2016-02-22 NOTE — Telephone Encounter (Signed)
Okay to restart trazodone, send one month supply. Due for a visit 05-2016

## 2016-03-16 ENCOUNTER — Emergency Department (HOSPITAL_COMMUNITY)
Admission: EM | Admit: 2016-03-16 | Discharge: 2016-03-16 | Disposition: A | Discharge status: Home / Self Care | Payer: Self-pay | Attending: Emergency Medicine | Admitting: Emergency Medicine

## 2016-03-16 ENCOUNTER — Emergency Department (HOSPITAL_COMMUNITY): Payer: Self-pay

## 2016-03-16 ENCOUNTER — Encounter (HOSPITAL_COMMUNITY): Payer: Self-pay | Admitting: Emergency Medicine

## 2016-03-16 DIAGNOSIS — Z8639 Personal history of other endocrine, nutritional and metabolic disease: Secondary | ICD-10-CM | POA: Insufficient documentation

## 2016-03-16 DIAGNOSIS — Z79899 Other long term (current) drug therapy: Secondary | ICD-10-CM | POA: Insufficient documentation

## 2016-03-16 DIAGNOSIS — Z87448 Personal history of other diseases of urinary system: Secondary | ICD-10-CM | POA: Insufficient documentation

## 2016-03-16 DIAGNOSIS — Z8669 Personal history of other diseases of the nervous system and sense organs: Secondary | ICD-10-CM | POA: Insufficient documentation

## 2016-03-16 DIAGNOSIS — F329 Major depressive disorder, single episode, unspecified: Secondary | ICD-10-CM | POA: Insufficient documentation

## 2016-03-16 DIAGNOSIS — R079 Chest pain, unspecified: Secondary | ICD-10-CM | POA: Insufficient documentation

## 2016-03-16 DIAGNOSIS — Z793 Long term (current) use of hormonal contraceptives: Secondary | ICD-10-CM | POA: Insufficient documentation

## 2016-03-16 DIAGNOSIS — Z872 Personal history of diseases of the skin and subcutaneous tissue: Secondary | ICD-10-CM | POA: Insufficient documentation

## 2016-03-16 DIAGNOSIS — Z87891 Personal history of nicotine dependence: Secondary | ICD-10-CM | POA: Insufficient documentation

## 2016-03-16 DIAGNOSIS — F419 Anxiety disorder, unspecified: Secondary | ICD-10-CM | POA: Insufficient documentation

## 2016-03-16 LAB — COMPREHENSIVE METABOLIC PANEL
ALBUMIN: 3.9 g/dL (ref 3.5–5.0)
ALT: 31 U/L (ref 17–63)
ANION GAP: 15 (ref 5–15)
AST: 23 U/L (ref 15–41)
Alkaline Phosphatase: 44 U/L (ref 38–126)
BUN: 14 mg/dL (ref 6–20)
CHLORIDE: 101 mmol/L (ref 101–111)
CO2: 22 mmol/L (ref 22–32)
Calcium: 9.2 mg/dL (ref 8.9–10.3)
Creatinine, Ser: 0.69 mg/dL (ref 0.61–1.24)
GFR calc Af Amer: >60 mL/min (ref >60)
GFR calc non Af Amer: >60 mL/min (ref >60)
GLUCOSE: 117 mg/dL — AB (ref 65–99)
POTASSIUM: 3.3 mmol/L — AB (ref 3.5–5.1)
SODIUM: 138 mmol/L (ref 135–145)
Total Bilirubin: 1 mg/dL (ref 0.3–1.2)
Total Protein: 7 g/dL (ref 6.5–8.1)

## 2016-03-16 LAB — CBC
HCT: 36.6 % — ABNORMAL LOW (ref 39.0–52.0)
HEMOGLOBIN: 12.2 g/dL — AB (ref 13.0–17.0)
MCH: 29 pg (ref 26.0–34.0)
MCHC: 33.3 g/dL (ref 30.0–36.0)
MCV: 86.9 fL (ref 78.0–100.0)
Platelets: 263 10*3/uL (ref 150–400)
RBC: 4.21 MIL/uL — AB (ref 4.22–5.81)
RDW: 12.9 % (ref 11.5–15.5)
WBC: 7.8 10*3/uL (ref 4.0–10.5)

## 2016-03-16 LAB — I-STAT TROPONIN, ED: Troponin i, poc: 0 ng/mL (ref 0.00–0.08)

## 2016-03-16 LAB — D-DIMER, QUANTITATIVE (NOT AT ARMC)

## 2016-03-16 MED ORDER — MORPHINE SULFATE (PF) 4 MG/ML IV SOLN
4.0000 mg | Freq: Once | INTRAVENOUS | Status: DC
  Filled 2016-03-16: qty 1

## 2016-03-16 MED ORDER — KETOROLAC TROMETHAMINE 30 MG/ML IJ SOLN
30.0000 mg | Freq: Once | INTRAMUSCULAR | Status: AC
  Administered 2016-03-16: 30 mg via INTRAVENOUS
  Filled 2016-03-16: qty 1

## 2016-03-16 MED ORDER — ONDANSETRON HCL 4 MG/2ML IJ SOLN
4.0000 mg | Freq: Once | INTRAMUSCULAR | Status: DC
  Filled 2016-03-16: qty 2

## 2016-03-16 NOTE — ED Notes (Signed)
Per EMS, pt from home with c/o chest tightness. Tightness in the central chest, left arm tingling x 2-3 days. Pt received 324 aspirin PTA.

## 2016-03-16 NOTE — Discharge Instructions (Signed)
Nonspecific Chest Pain  °Chest pain can be caused by many different conditions. There is always a chance that your pain could be related to something serious, such as a heart attack or a blood clot in your lungs. Chest pain can also be caused by conditions that are not life-threatening. If you have chest pain, it is very important to follow up with your health care provider. °CAUSES  °Chest pain can be caused by: °· Heartburn. °· Pneumonia or bronchitis. °· Anxiety or stress. °· Inflammation around your heart (pericarditis) or lung (pleuritis or pleurisy). °· A blood clot in your lung. °· A collapsed lung (pneumothorax). It can develop suddenly on its own (spontaneous pneumothorax) or from trauma to the chest. °· Shingles infection (varicella-zoster virus). °· Heart attack. °· Damage to the bones, muscles, and cartilage that make up your chest wall. This can include: °¨ Bruised bones due to injury. °¨ Strained muscles or cartilage due to frequent or repeated coughing or overwork. °¨ Fracture to one or more ribs. °¨ Sore cartilage due to inflammation (costochondritis). °RISK FACTORS  °Risk factors for chest pain may include: °· Activities that increase your risk for trauma or injury to your chest. °· Respiratory infections or conditions that cause frequent coughing. °· Medical conditions or overeating that can cause heartburn. °· Heart disease or family history of heart disease. °· Conditions or health behaviors that increase your risk of developing a blood clot. °· Having had chicken pox (varicella zoster). °SIGNS AND SYMPTOMS °Chest pain can feel like: °· Burning or tingling on the surface of your chest or deep in your chest. °· Crushing, pressure, aching, or squeezing pain. °· Dull or sharp pain that is worse when you move, cough, or take a deep breath. °· Pain that is also felt in your back, neck, shoulder, or arm, or pain that spreads to any of these areas. °Your chest pain may come and go, or it may stay  constant. °DIAGNOSIS °Lab tests or other studies may be needed to find the cause of your pain. Your health care provider may have you take a test called an ambulatory ECG (electrocardiogram). An ECG records your heartbeat patterns at the time the test is performed. You may also have other tests, such as: °· Transthoracic echocardiogram (TTE). During echocardiography, sound waves are used to create a picture of all of the heart structures and to look at how blood flows through your heart. °· Transesophageal echocardiogram (TEE). This is a more advanced imaging test that obtains images from inside your body. It allows your health care provider to see your heart in finer detail. °· Cardiac monitoring. This allows your health care provider to monitor your heart rate and rhythm in real time. °· Holter monitor. This is a portable device that records your heartbeat and can help to diagnose abnormal heartbeats. It allows your health care provider to track your heart activity for several days, if needed. °· Stress tests. These can be done through exercise or by taking medicine that makes your heart beat more quickly. °· Blood tests. °· Imaging tests. °TREATMENT  °Your treatment depends on what is causing your chest pain. Treatment may include: °· Medicines. These may include: °¨ Acid blockers for heartburn. °¨ Anti-inflammatory medicine. °¨ Pain medicine for inflammatory conditions. °¨ Antibiotic medicine, if an infection is present. °¨ Medicines to dissolve blood clots. °¨ Medicines to treat coronary artery disease. °· Supportive care for conditions that do not require medicines. This may include: °¨ Resting. °¨ Applying heat   or cold packs to injured areas. °¨ Limiting activities until pain decreases. °HOME CARE INSTRUCTIONS °· If you were prescribed an antibiotic medicine, finish it all even if you start to feel better. °· Avoid any activities that bring on chest pain. °· Do not use any tobacco products, including  cigarettes, chewing tobacco, or electronic cigarettes. If you need help quitting, ask your health care provider. °· Do not drink alcohol. °· Take medicines only as directed by your health care provider. °· Keep all follow-up visits as directed by your health care provider. This is important. This includes any further testing if your chest pain does not go away. °· If heartburn is the cause for your chest pain, you may be told to keep your head raised (elevated) while sleeping. This reduces the chance that acid will go from your stomach into your esophagus. °· Make lifestyle changes as directed by your health care provider. These may include: °¨ Getting regular exercise. Ask your health care provider to suggest some activities that are safe for you. °¨ Eating a heart-healthy diet. A registered dietitian can help you to learn healthy eating options. °¨ Maintaining a healthy weight. °¨ Managing diabetes, if necessary. °¨ Reducing stress. °SEEK MEDICAL CARE IF: °· Your chest pain does not go away after treatment. °· You have a rash with blisters on your chest. °· You have a fever. °SEEK IMMEDIATE MEDICAL CARE IF:  °· Your chest pain is worse. °· You have an increasing cough, or you cough up blood. °· You have severe abdominal pain. °· You have severe weakness. °· You faint. °· You have chills. °· You have sudden, unexplained chest discomfort. °· You have sudden, unexplained discomfort in your arms, back, neck, or jaw. °· You have shortness of breath at any time. °· You suddenly start to sweat, or your skin gets clammy. °· You feel nauseous or you vomit. °· You suddenly feel light-headed or dizzy. °· Your heart begins to beat quickly, or it feels like it is skipping beats. °These symptoms may represent a serious problem that is an emergency. Do not wait to see if the symptoms will go away. Get medical help right away. Call your local emergency services (911 in the [place] Do not drive yourself to the hospital. °  °This  information is not intended to replace advice given to you by your health care provider. Make sure you discuss any questions you have with your health care provider. °  °Document Released: 08/08/2005 Document Revised: 11/19/2014 Document Reviewed: 06/04/2014 °Elsevier Interactive Patient Education ©2016 [employer] ° °

## 2016-03-16 NOTE — ED Provider Notes (Signed)
CSN: [id number]     Arrival date & time 03/16/16  0409 History   First MD Initiated Contact with Patient 03/16/16 0421     Chief Complaint  Patient presents with  . Chest Pain     (Consider location/radiation/quality/duration/timing/severity/associated sxs/prior Treatment) HPI Comments: Patient presents to the emergency department for evaluation of chest discomfort. Patient has been experiencing tightness in the center of the chest radiating to the left arm with tingling in the arm that has been ongoing for the last 2 or 3 days. Patient reports that the symptoms are intermittent. Sometimes it lasts for minutes, other times it lasts for an hour or more. Patient has not identified any exacerbating or alleviating factors.  Patient is a 30 y.o. male presenting with chest pain.  Chest Pain   Past Medical History  Diagnosis Date  . Insomnia   . Anxiety and depression   . Allergic rhinitis   . Eczema   . 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 HYPERLIPIDEMIA 11/16/2010  . Hypercholesteremia   . Urolithiasis 07/22/2013   Past Surgical History  Procedure Laterality Date  . No past surgeries     Family History  Problem Relation Age of Onset  . Colon cancer Neg Hx   . Prostate cancer Neg Hx   . Diabetes Neg Hx   . Heart attack Neg Hx    Social History  Substance Use Topics  . Smoking status: Former Smoker    Quit date: 04/13/2011  . Smokeless tobacco: Never Used  . Alcohol Use: Yes     Comment: Socially    Review of Systems  Cardiovascular: Positive for chest pain.  All other systems reviewed and are negative.     Allergies  Review of patient's allergies indicates no known allergies.  Home Medications   Prior to Admission medications   Medication Sig Start Date End Date Taking? Authorizing Provider  ALPRAZolam Prudy Feeler(XANAX) 0.5 MG tablet Take 1 tablet (0.5 mg total) by mouth 3 (three) times daily as  needed for anxiety. 12/07/15  Yes Wanda PlumpJose E Paz, MD  estradiol (ESTRACE) 2 MG tablet Take 2 mg by mouth 2 (two) times daily.   Yes Historical Provider, MD  spironolactone (ALDACTONE) 100 MG tablet Take 200 mg by mouth daily.   Yes Historical Provider, MD  naproxen (NAPROSYN) 500 MG tablet Take 1 tablet (500 mg total) by mouth 2 (two) times daily. Patient not taking: Reported on 02/14/2016 01/19/16   Gilda Creasehristopher J Nary Sneed, MD  traZODone (DESYREL) 50 MG tablet Take 1 tablet (50 mg total) by mouth at bedtime as needed for sleep. Patient not taking: Reported on 03/16/2016 02/22/16   Wanda PlumpJose E Paz, MD   BP 122/73 mmHg  Pulse 66  Temp(Src) 98.2 F (36.8 C) (Oral)  Resp 16  SpO2 100% Physical Exam  Constitutional: He is oriented to person, place, and time. He appears well-developed and well-nourished. No distress.  HENT:  Head: Normocephalic and atraumatic.  Right Ear: Hearing normal.  Left Ear: Hearing normal.  Nose: Nose normal.  Mouth/Throat: Oropharynx is clear and moist and mucous membranes are normal.  Eyes: Conjunctivae and EOM are normal. Pupils are equal, round, and reactive to light.  Neck: Normal range of motion. Neck supple.  Cardiovascular: Regular rhythm, S1 normal and S2 normal.  Exam reveals no gallop and no friction rub.   No murmur heard. Pulmonary/Chest: Effort normal and breath sounds normal. No respiratory distress.  He exhibits no tenderness.  Abdominal: Soft. Normal appearance and bowel sounds are normal. There is no hepatosplenomegaly. There is no tenderness. There is no rebound, no guarding, no tenderness at McBurney's point and negative Murphy's sign. No hernia.  Musculoskeletal: Normal range of motion.  Neurological: He is alert and oriented to person, place, and time. He has normal strength. No cranial nerve deficit or sensory deficit. Coordination normal. GCS eye subscore is 4. GCS verbal subscore is 5. GCS motor subscore is 6.  Skin: Skin is warm, dry and intact. No rash noted.  No cyanosis.  Psychiatric: He has a normal mood and affect. His speech is normal and behavior is normal. Thought content normal.  Nursing note and vitals reviewed.   ED Course  Procedures (including critical care time) Labs Review Labs Reviewed  COMPREHENSIVE METABOLIC PANEL - Abnormal; Notable for the following:    Potassium 3.3 (*)    Glucose, Bld 117 (*)    All other components within normal limits  CBC - Abnormal; Notable for the following:    RBC 4.21 (*)    Hemoglobin 12.2 (*)    HCT 36.6 (*)    All other components within normal limits  D-DIMER, QUANTITATIVE (NOT AT [facility])  Rosezena Sensor, ED    Imaging Review Dg Chest 2 View  03/16/2016  CLINICAL DATA:  Chest pain and tightness tonight radiating down the left arm. Cough. Nonsmoker. EXAM: CHEST  2 VIEW COMPARISON:  01/18/2016 FINDINGS: The heart size and mediastinal contours are within normal limits. Both lungs are clear. The visualized skeletal structures are unremarkable. IMPRESSION: No active cardiopulmonary disease. Electronically Signed   By: Burman Nieves M.D.   On: 03/16/2016 05:06   I have personally reviewed and evaluated these images and lab results as part of my medical decision-making.   EKG Interpretation   Date/Time:  Friday Mar 16 2016 04:16:38 EDT Ventricular Rate:  89 PR Interval:  154 QRS Duration: 96 QT Interval:  361 QTC Calculation: 439 R Axis:   51 Text Interpretation:  Sinus rhythm Baseline wander in lead(s) V1 Normal  ECG Confirmed by Aldora Perman  MD, Maanya Hippert [phone]) on 03/16/2016 4:19:30 AM      MDM   Final diagnoses:  Chest pain, unspecified chest pain type    Patient presents to the emergency department for evaluation of chest pain. Patient has been seen previously with similar complaints. Patient is, however, at risk for thromboembolic events because of estrogen use. His current symptoms, however, are very inconsistent with PE. It is felt that his current negative d-dimer and  history of similar symptoms is enough to rule out PE and he does not require imaging.  Gilda Crease, MD 03/16/16 [phone]

## 2016-03-16 NOTE — ED Notes (Signed)
Pt verbalizes understanding of instructions. 

## 2016-03-22 ENCOUNTER — Encounter: Payer: Self-pay | Admitting: Internal Medicine

## 2016-03-22 ENCOUNTER — Ambulatory Visit (INDEPENDENT_AMBULATORY_CARE_PROVIDER_SITE_OTHER): Payer: Self-pay | Admitting: Internal Medicine

## 2016-03-22 VITALS — BP 106/68 | HR 85 | Temp 98.0°F | Ht 66.0 in | Wt 180.2 lb

## 2016-03-22 DIAGNOSIS — R059 Cough, unspecified: Secondary | ICD-10-CM

## 2016-03-22 DIAGNOSIS — Z09 Encounter for follow-up examination after completed treatment for conditions other than malignant neoplasm: Secondary | ICD-10-CM

## 2016-03-22 DIAGNOSIS — R05 Cough: Secondary | ICD-10-CM

## 2016-03-22 MED ORDER — PANTOPRAZOLE SODIUM 40 MG PO TBEC: 40.0000 mg | DELAYED_RELEASE_TABLET | Freq: Every day | ORAL | Status: DC

## 2016-03-22 MED ORDER — ALBUTEROL SULFATE HFA 108 (90 BASE) MCG/ACT IN AERS: 2.0000 | INHALATION_SPRAY | Freq: Four times a day (QID) | RESPIRATORY_TRACT | Status: DC | PRN

## 2016-03-22 NOTE — Patient Instructions (Signed)
Take pantoprazole 1 tablet before breakfast every day for 6 weeks  If your cough is not improving with acid reflux treatment, then try albuterol 2 puffs 3 times a day as needed with cough.  If you are not improving in the next few weeks please let me know

## 2016-03-22 NOTE — Progress Notes (Signed)
Pre visit review using our clinic review tool, if applicable. No additional management support is needed unless otherwise documented below in the visit note. 

## 2016-03-22 NOTE — Assessment & Plan Note (Signed)
Cough-- GERD vs cough variant asthma?. he is on HRT, no leg swelling, recent d-dimer negative. Recommend a trial with pantoprazole follow-up by a trial with albuterol if not better with GERD treatment. Response to the trial will help determine the etiology of the symptoms. RTC 05-2016 as already scheduled

## 2016-03-22 NOTE — Progress Notes (Signed)
Subjective:    Patient ID: Ethan Harris, male    DOB: October 24, 1986, 30 y.o.   MRN: [id number]  DOS:  03/22/2016 Type of visit - description : Acute visit, asthma? Interval history: Symptoms started a week ago with chest and throat tightness, mild cough, occasionally productive. Cough is worse late at night and during the morning. Pt was concerned because he is taking hormones, went to the ER, d-dimer was negative, chest x-ray negative. Hemoglobin slightly low at 12.2. At this point chest pain is gone but he continue with the other symptoms, had asthma as a child and  wonders if sx are asthma related  or GERD.   Review of Systems Denies fever, chills No itchy  eyes or nose. Occasional sneezing, at baseline.  denies any sinus pain or congestion Very rarely has heartburn. No recent per se No lower extremity edema.  Past Medical History  Diagnosis Date  . Insomnia   . Anxiety and depression   . Allergic rhinitis   . Eczema   . 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 HYPERLIPIDEMIA 11/16/2010  . Hypercholesteremia   . Urolithiasis 07/22/2013    Past Surgical History  Procedure Laterality Date  . No past surgeries      Social History   Social History  . Marital Status: Single    Spouse Name: N/A  . Number of Children: 0  . Years of Education: N/A   Occupational History  . entertainer     Social History Main Topics  . Smoking status: Former Smoker    Quit date: 04/13/2011  . Smokeless tobacco: Never Used  . Alcohol Use: Yes     Comment: Socially  . Drug Use: No  . Sexual Activity: Yes   Other Topics Concern  . Not on file   Social History Narrative   Lives w/ 2 room mates    (mom- Jennifer [place])             Medication List       This list is accurate as of: 03/22/16  4:24 PM.  Always use your most recent med list.               albuterol 108 (90 Base) MCG/ACT inhaler    Commonly known as:  VENTOLIN HFA  Inhale 2 puffs into the lungs every 6 (six) hours as needed for wheezing or shortness of breath.     ALPRAZolam 0.5 MG tablet  Commonly known as:  XANAX  Take 1 tablet (0.5 mg total) by mouth 3 (three) times daily as needed for anxiety.     estradiol 2 MG tablet  Commonly known as:  ESTRACE  Take 2 mg by mouth 2 (two) times daily.     naproxen 500 MG tablet  Commonly known as:  NAPROSYN  Take 1 tablet (500 mg total) by mouth 2 (two) times daily.     pantoprazole 40 MG tablet  Commonly known as:  PROTONIX  Take 1 tablet (40 mg total) by mouth daily.     spironolactone 100 MG tablet  Commonly known as:  ALDACTONE  Take 37.5 mg by mouth daily.     traZODone 50 MG tablet  Commonly known as:  DESYREL  Take 1 tablet (50 mg total) by mouth at bedtime as needed for sleep.           Objective:   Physical Exam BP 106/68 mmHg  Pulse 85  Temp(Src) 98 F (36.7 C) (Oral)  Ht  (1.676 m)  Wt 180 lb 4 oz (81.761 kg)  BMI 29.11 kg/m2  SpO2 96% General:   Well developed, well nourished . NAD.  HEENT:  Normocephalic . Face symmetric, atraumatic. Nose clear, sinuses no TTP, TMs normal, throat no redness Lungs:  CTA B Normal respiratory effort, no intercostal retractions, no accessory muscle use. Heart: RRR,  no murmur.  No pretibial edema bilaterally  Skin: Not pale. Not jaundice Neurologic:  alert & oriented X3.  Speech normal, gait appropriate for age and unassisted Psych--  Cognition and judgment appear intact.  Cooperative with normal attention span and concentration.  Behavior appropriate. No anxious or depressed appearing.      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 he is on HRT, no leg swelling, recent d-dimer negative. Recommend  a trial with pantoprazole follow-up by a trial with albuterol if not better with GERD treatment. Response to the trial will help determine the etiology of the symptoms. RTC 05-2016 as already scheduled

## 2016-04-16 ENCOUNTER — Telehealth: Payer: Self-pay

## 2016-04-16 MED ORDER — ALPRAZOLAM 0.5 MG PO TABS: 0.5000 mg | ORAL_TABLET | Freq: Three times a day (TID) | ORAL | Status: DC | PRN

## 2016-04-16 NOTE — Telephone Encounter (Signed)
Rx faxed to [facility].  

## 2016-04-16 NOTE — Telephone Encounter (Signed)
Ok 90 and 1 RF 

## 2016-04-16 NOTE — Telephone Encounter (Signed)
Pt is requesting refill on Alprazolam.  Last OV: 03/22/2016 Last Fill: 12/07/2015 #90 and 1RF Pt sig: 1 tablet TID PRN UDS: 12/07/2015 Low risk  Please advise.

## 2016-04-16 NOTE — Telephone Encounter (Signed)
Rx printed, awaiting MD signature.  

## 2016-05-23 ENCOUNTER — Telehealth: Payer: Self-pay | Admitting: Internal Medicine

## 2016-05-29 NOTE — Telephone Encounter (Signed)
Error

## 2016-06-05 ENCOUNTER — Encounter: Payer: Self-pay | Admitting: Internal Medicine

## 2016-06-27 ENCOUNTER — Encounter (HOSPITAL_BASED_OUTPATIENT_CLINIC_OR_DEPARTMENT_OTHER): Payer: Self-pay | Admitting: Emergency Medicine

## 2016-06-27 ENCOUNTER — Emergency Department (HOSPITAL_BASED_OUTPATIENT_CLINIC_OR_DEPARTMENT_OTHER)
Admission: EM | Admit: 2016-06-27 | Discharge: 2016-06-27 | Disposition: A | Discharge status: Home / Self Care | Payer: Self-pay | Attending: Emergency Medicine | Admitting: Emergency Medicine

## 2016-06-27 DIAGNOSIS — R0789 Other chest pain: Secondary | ICD-10-CM | POA: Insufficient documentation

## 2016-06-27 DIAGNOSIS — Z87891 Personal history of nicotine dependence: Secondary | ICD-10-CM | POA: Insufficient documentation

## 2016-06-27 LAB — CBC WITH DIFFERENTIAL/PLATELET
Basophils Absolute: 0 10*3/uL (ref 0.0–0.1)
Basophils Relative: 0 %
Eosinophils Absolute: 0.2 10*3/uL (ref 0.0–0.7)
Eosinophils Relative: 2 %
HEMATOCRIT: 37.2 % — AB (ref 39.0–52.0)
HEMOGLOBIN: 13.1 g/dL (ref 13.0–17.0)
LYMPHS ABS: 3.3 10*3/uL (ref 0.7–4.0)
LYMPHS PCT: 34 %
MCH: 29.4 pg (ref 26.0–34.0)
MCHC: 35.2 g/dL (ref 30.0–36.0)
MCV: 83.4 fL (ref 78.0–100.0)
MONOS PCT: 9 %
Monocytes Absolute: 0.8 10*3/uL (ref 0.1–1.0)
NEUTROS ABS: 5.3 10*3/uL (ref 1.7–7.7)
NEUTROS PCT: 55 %
PLATELETS: 264 10*3/uL (ref 150–400)
RBC: 4.46 MIL/uL (ref 4.22–5.81)
RDW: 12.8 % (ref 11.5–15.5)
WBC: 9.6 10*3/uL (ref 4.0–10.5)

## 2016-06-27 LAB — COMPREHENSIVE METABOLIC PANEL
ALBUMIN: 4.3 g/dL (ref 3.5–5.0)
ALT: 20 U/L (ref 17–63)
AST: 23 U/L (ref 15–41)
Alkaline Phosphatase: 46 U/L (ref 38–126)
Anion gap: 8 (ref 5–15)
BUN: 14 mg/dL (ref 6–20)
CHLORIDE: 104 mmol/L (ref 101–111)
CO2: 23 mmol/L (ref 22–32)
Calcium: 8.9 mg/dL (ref 8.9–10.3)
Creatinine, Ser: 0.67 mg/dL (ref 0.61–1.24)
GFR calc Af Amer: >60 mL/min (ref >60)
Glucose, Bld: 98 mg/dL (ref 65–99)
POTASSIUM: 3.4 mmol/L — AB (ref 3.5–5.1)
Sodium: 135 mmol/L (ref 135–145)
Total Bilirubin: 0.5 mg/dL (ref 0.3–1.2)
Total Protein: 7.5 g/dL (ref 6.5–8.1)

## 2016-06-27 LAB — D-DIMER, QUANTITATIVE: D-Dimer, Quant: 0.28 ug/mL-FEU (ref 0.00–0.50)

## 2016-06-27 LAB — TROPONIN I

## 2016-06-27 NOTE — ED Notes (Signed)
MD at bedside. 

## 2016-06-27 NOTE — ED Provider Notes (Signed)
[facility]   CSN: [id number] Arrival date & time: 06/27/16  0105     History   Chief Complaint Chief Complaint  Patient presents with  . Chest Pain    HPI Ethan Harris is a 30 y.o. male-to-male transgender who has been on estrogen therapy for about 6 months. The patient has been having intermittent chest pain for the past 2 days. Pain is sometimes located in the left pectoralis region, sometimes in the left axilla. The pains are vaguely described as fleeting or shooting. They are not severe, about 2-4 out of 10. They're not associated with shortness of breath, diaphoresis or nausea. The patient has also had some shooting pains in the calves bilaterally. None of these pains are constant. The patient admits to being anxious about blood clots as they can be a complication of estrogen therapy.  The patient has a history of illegal silicone injections in the cheeks and buttocks about 7 years ago. The patient has not undergone gender reassignment surgery.  HPI  Past Medical History:  Diagnosis Date  . Allergic rhinitis   . Anxiety and depression   . Eczema   . 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 Hypercholesteremia   . HYPERLIPIDEMIA 11/16/2010  . Insomnia   . Urolithiasis 07/22/2013    Patient Active Problem List   Diagnosis Date Noted  . PCP NOTES >>>>>>>>>>>>>>>>.. 03/22/2016  . Transgender 12/08/2015  . Urolithiasis 07/22/2013  . Annual physical exam 04/09/2012  . HYPERLIPIDEMIA 11/16/2010  . Anxiety state 05/06/2007  . Insomnia 05/06/2007  . ALLERGIC RHINITIS 05/02/2007  . ASTHMA 05/02/2007    Past Surgical History:  Procedure Laterality Date  . NO PAST SURGERIES      OB History    No data available       Home Medications    Prior to Admission medications   Medication Sig Start Date End Date Taking? Authorizing Provider  albuterol (VENTOLIN HFA) 108 (90  Base) MCG/ACT inhaler Inhale 2 puffs into the lungs every 6 (six) hours as needed for wheezing or shortness of breath. 03/22/16   Wanda PlumpJose E Paz, MD  ALPRAZolam Prudy Feeler(XANAX) 0.5 MG tablet Take 1 tablet (0.5 mg total) by mouth 3 (three) times daily as needed for anxiety. 04/16/16   Wanda PlumpJose E Paz, MD  estradiol (ESTRACE) 2 MG tablet Take 2 mg by mouth 2 (two) times daily.    Historical Provider, MD  pantoprazole (PROTONIX) 40 MG tablet Take 1 tablet (40 mg total) by mouth daily. 03/22/16   Wanda PlumpJose E Paz, MD  spironolactone (ALDACTONE) 100 MG tablet Take 37.5 mg by mouth daily.     Historical Provider, MD  traZODone (DESYREL) 50 MG tablet Take 1 tablet (50 mg total) by mouth at bedtime as needed for sleep. 02/22/16   Wanda PlumpJose E Paz, MD    Family History Family History  Problem Relation Age of Onset  . Colon cancer Neg Hx   . Prostate cancer Neg Hx   . Diabetes Neg Hx   . Heart attack Neg Hx     Social History Social History  Substance Use Topics  . Smoking status: Former Smoker    Quit date: 04/13/2011  . Smokeless tobacco: Never Used  . Alcohol use Yes     Comment: Socially     Allergies   Review of patient's allergies indicates no known allergies.   Review of Systems Review of Systems  All other systems reviewed and are negative.    Physical Exam Updated Vital Signs BP 134/100 (BP Location: Left Arm)   Pulse 106   Temp 98.3 F (36.8 C) (Oral)   Resp 16   Ht 5' (1.524 m)   Wt 180 lb (81.6 kg)   SpO2 100%   BMI 35.15 kg/m   Physical Exam General: Well-developed, well-nourished trans-male in no acute distress; appearance consistent with age of record HENT: normocephalic; atraumatic Eyes: pupils equal, round and reactive to light; extraocular muscles intact Neck: supple Heart: regular rate and rhythm Lungs: clear to auscultation bilaterally Abdomen: soft; nondistended; nontender; no masses or hepatosplenomegaly; bowel sounds present Extremities: No deformity; full range of motion;  pulses normal; no calf tenderness Neurologic: Awake, alert and oriented; motor function intact in all extremities and symmetric; no facial droop Skin: Warm and dry Psychiatric: Normal mood and affect    ED Treatments / Results   Nursing notes and vitals signs, including pulse oximetry, reviewed.  Summary of this visit's results, reviewed by myself:   EKG Interpretation  Date/Time:  Wednesday June 27 2016 01:34:52 EDT Ventricular Rate:  79 PR Interval:    QRS Duration: 100 QT Interval:  367 QTC Calculation: 421 R Axis:   53 Text Interpretation:  Sinus rhythm Normal ECG No significant change was found Confirmed by Taite Schoeppner  MD, Jonny Ruiz ([id number]) on 06/27/2016 3:13:54 AM       Labs:  Results for orders placed or performed during the hospital encounter of 06/27/16 (from the past 24 hour(s))  CBC with Differential     Status: Abnormal   Collection Time: 06/27/16  1:23 AM  Result Value Ref Range   WBC 9.6 4.0 - 10.5 K/uL   RBC 4.46 4.22 - 5.81 MIL/uL   Hemoglobin 13.1 13.0 - 17.0 g/dL   HCT 19.1 (L) 47.8 - 29.5 %   MCV 83.4 78.0 - 100.0 fL   MCH 29.4 26.0 - 34.0 pg   MCHC 35.2 30.0 - 36.0 g/dL   RDW 62.1 30.8 - 65.7 %   Platelets 264 150 - 400 K/uL   Neutrophils Relative % 55 %   Neutro Abs 5.3 1.7 - 7.7 K/uL   Lymphocytes Relative 34 %   Lymphs Abs 3.3 0.7 - 4.0 K/uL   Monocytes Relative 9 %   Monocytes Absolute 0.8 0.1 - 1.0 K/uL   Eosinophils Relative 2 %   Eosinophils Absolute 0.2 0.0 - 0.7 K/uL   Basophils Relative 0 %   Basophils Absolute 0.0 0.0 - 0.1 K/uL  Comprehensive metabolic panel     Status: Abnormal   Collection Time: 06/27/16  1:23 AM  Result Value Ref Range   Sodium 135 135 - 145 mmol/L   Potassium 3.4 (L) 3.5 - 5.1 mmol/L   Chloride 104 101 - 111 mmol/L   CO2 23 22 - 32 mmol/L   Glucose, Bld 98 65 - 99 mg/dL   BUN 14 6 - 20 mg/dL   Creatinine, Ser 8.46 0.61 - 1.24 mg/dL   Calcium 8.9 8.9 - 96.2 mg/dL   Total Protein 7.5 6.5 - 8.1 g/dL   Albumin 4.3  3.5 - 5.0 g/dL   AST 23 15 - 41 U/L   ALT 20 17 - 63 U/L   Alkaline Phosphatase 46 38 - 126 U/L   Total Bilirubin 0.5 0.3 - 1.2 mg/dL   GFR calc non Af Amer >60 >60 mL/min   GFR calc Af Amer >60 >60 mL/min   Anion  gap 8 5 - 15  Troponin I     Status: None   Collection Time: 06/27/16  1:23 AM  Result Value Ref Range   Troponin I <0.03 <0.03 ng/mL  D-dimer, quantitative (not at [facility])     Status: None   Collection Time: 06/27/16  1:30 AM  Result Value Ref Range   D-Dimer, Quant 0.28 0.00 - 0.50 ug/mL-FEU    Imaging Studies: No results found.  Procedures (including critical care time)   Final Clinical Impressions(s) / ED Diagnoses   Final diagnoses:  Atypical chest pain       Paula LibraJohn Hanaa Payes, MD 06/27/16 [phone]

## 2016-06-27 NOTE — ED Triage Notes (Signed)
Pt reports intermittent chest over the last 3-4 days with occasional bilat calve pain pt fears blood clots due to take hormone therapy d/t transgender status

## 2016-06-27 NOTE — ED Notes (Signed)
Pt ambulatory without difficulty. Updated on reason for delay.

## 2016-07-03 ENCOUNTER — Telehealth: Payer: Self-pay | Admitting: Internal Medicine

## 2016-07-05 MED ORDER — ALPRAZOLAM 0.5 MG PO TABS: 0.5000 mg | ORAL_TABLET | Freq: Three times a day (TID) | ORAL | 0 refills | Status: DC | PRN

## 2016-07-05 NOTE — Telephone Encounter (Signed)
Rx faxed to [facility].  

## 2016-07-05 NOTE — Telephone Encounter (Signed)
Pt called in to follow up on refill request. Informed pt that a ER follow up is needed per Rx denial notes. Scheduled pt FU for tomorrow. Pt says that he will be out of his medication today.

## 2016-07-05 NOTE — Addendum Note (Signed)
Addended by: Willow OraPAZ, JOSE E on: 07/05/2016 02:28 PM   Modules accepted: Orders

## 2016-07-05 NOTE — Telephone Encounter (Addendum)
Okay #90, no refills, rx printed

## 2016-07-05 NOTE — Telephone Encounter (Signed)
Pt is requesting refill on Alprazolam.  Last OV: 03/22/2016, was at ED on 06/27/2016 for chest pain, ED f/u scheduled 07/06/2016 Last Fill: 04/16/2016 #90 and 1RF Pt sig:1 tab tid prn UDS: 12/07/2015 Low risk  Please advise.

## 2016-07-06 ENCOUNTER — Ambulatory Visit (INDEPENDENT_AMBULATORY_CARE_PROVIDER_SITE_OTHER): Payer: Self-pay | Admitting: Internal Medicine

## 2016-07-06 ENCOUNTER — Encounter: Payer: Self-pay | Admitting: Internal Medicine

## 2016-07-06 VITALS — BP 108/68 | HR 79 | Temp 98.1°F | Resp 14 | Ht 66.0 in | Wt 182.4 lb

## 2016-07-06 DIAGNOSIS — R0789 Other chest pain: Secondary | ICD-10-CM

## 2016-07-06 DIAGNOSIS — F411 Generalized anxiety disorder: Secondary | ICD-10-CM

## 2016-07-06 NOTE — Progress Notes (Signed)
Pre visit review using our clinic review tool, if applicable. No additional management support is needed unless otherwise documented below in the visit note. 

## 2016-07-06 NOTE — Progress Notes (Signed)
Subjective:    Patient ID: Ethan Harris, male    DOB: August 21, 1986, 30 y.o.   MRN: [id number]  DOS:  07/06/2016 Type of visit - description : ER follow-up Interval history:  Ethan FlesherWent to the ER 06/27/2016, complaining of chest pain. Notes and labs reviewed: CBC, CMP, troponin, d-dimer and EKG were unremarkable except for a potassium of 3.4  Since he left the emergency room symptoms are essentially gone, occasionally has a fleeting chest discomfort.  Also, he thinks symptoms were related to anxiety, he was not taking Zoloft, using Xanax more frequently lately. Has decided to go back on Zoloft gradually, first dose today.  Review of Systems No fever chills No difficulty breathing or lower extremity edema No nausea, vomiting, diarrhea No cough, does not need albuterol but very infrequently GERD symptoms under excellent control.    Past Medical History:  Diagnosis Date  . Allergic rhinitis   . Anxiety and depression   . Eczema   . 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 Hypercholesteremia   . HYPERLIPIDEMIA 11/16/2010  . Insomnia   . Urolithiasis 07/22/2013    Past Surgical History:  Procedure Laterality Date  . NO PAST SURGERIES      Social History   Social History  . Marital status: Single    Spouse name: N/A  . Number of children: 0  . Years of education: N/A   Occupational History  . entertainer     Social History Main Topics  . Smoking status: Former Smoker    Quit date: 04/13/2011  . Smokeless tobacco: Never Used  . Alcohol use Yes     Comment: Socially  . Drug use: No  . Sexual activity: Yes   Other Topics Concern  . Not on file   Social History Narrative   Lives w/ 2 room mates    (mom- Ethan Harris)             Medication List       Accurate as of 07/06/16 11:59 PM. Always use your most recent med list.          albuterol 108 (90 Base) MCG/ACT inhaler Commonly known  as:  VENTOLIN HFA Inhale 2 puffs into the lungs every 6 (six) hours as needed for wheezing or shortness of breath.   ALPRAZolam 0.5 MG tablet Commonly known as:  XANAX Take 1 tablet (0.5 mg total) by mouth 3 (three) times daily as needed for anxiety.   estradiol 2 MG tablet Commonly known as:  ESTRACE Take 2 mg by mouth 2 (two) times daily.   pantoprazole 40 MG tablet Commonly known as:  PROTONIX Take 1 tablet (40 mg total) by mouth daily.   sertraline 100 MG tablet Commonly known as:  ZOLOFT Take 100 mg by mouth daily.   spironolactone 25 MG tablet Commonly known as:  ALDACTONE Take 25 mg by mouth daily.   traZODone 50 MG tablet Commonly known as:  DESYREL Take 1 tablet (50 mg total) by mouth at bedtime as needed for sleep.          Objective:   Physical Exam BP 108/68 (BP Location: Left Arm, Patient Position: Sitting, Cuff Size: Normal)   Pulse 79   Temp 98.1 F (36.7 C) (Oral)   Resp 14   Ht 5\' 6"  (1.676 m)   Wt 182 lb 6 oz (82.7 kg)   SpO2 97%   BMI 29.44 kg/m  General:   Well developed, well nourished . NAD.  HEENT:  Normocephalic . Face symmetric, atraumatic Lungs:  CTA B Normal respiratory effort, no intercostal retractions, no accessory muscle use. Heart: RRR,  no murmur.  No pretibial edema bilaterally  Skin: Not pale. Not jaundice Neurologic:  alert & oriented X3.  Speech normal, gait appropriate for age and unassisted Psych--  Cognition and judgment appear intact.  Cooperative with normal attention span and concentration.  Behavior appropriate. No anxious or depressed appearing.      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 Patient thinks related to anxiety Anxiety, depression, insomnia: Stopped taking  Zoloft, just restarted today, will gradually go back on 100 mg daily (200 mg daily was too much for him). Will continue Xanax and trazodone. RTC in October as already scheduled for CPX

## 2016-07-08 NOTE — Assessment & Plan Note (Signed)
Chest pain: S/P ER eval, doing better, workup negative. Patient thinks related to anxiety Anxiety, depression, insomnia: Stopped taking Zoloft, just restarted today, will gradually go back on 100 mg daily (200 mg daily was too much for him). Will continue Xanax and trazodone. RTC in October as already scheduled for CPX

## 2016-07-11 ENCOUNTER — Encounter (HOSPITAL_COMMUNITY): Payer: Self-pay | Admitting: Emergency Medicine

## 2016-07-11 ENCOUNTER — Emergency Department (HOSPITAL_COMMUNITY)
Admission: EM | Admit: 2016-07-11 | Discharge: 2016-07-11 | Disposition: A | Discharge status: Home / Self Care | Payer: Self-pay | Attending: Emergency Medicine | Admitting: Emergency Medicine

## 2016-07-11 ENCOUNTER — Other Ambulatory Visit: Payer: Self-pay

## 2016-07-11 ENCOUNTER — Emergency Department (HOSPITAL_COMMUNITY): Payer: Self-pay

## 2016-07-11 DIAGNOSIS — R0789 Other chest pain: Secondary | ICD-10-CM | POA: Insufficient documentation

## 2016-07-11 DIAGNOSIS — Z87891 Personal history of nicotine dependence: Secondary | ICD-10-CM | POA: Insufficient documentation

## 2016-07-11 DIAGNOSIS — Z79899 Other long term (current) drug therapy: Secondary | ICD-10-CM | POA: Insufficient documentation

## 2016-07-11 DIAGNOSIS — J45909 Unspecified asthma, uncomplicated: Secondary | ICD-10-CM | POA: Insufficient documentation

## 2016-07-11 LAB — BASIC METABOLIC PANEL
Anion gap: 13 (ref 5–15)
BUN: 6 mg/dL (ref 6–20)
CHLORIDE: 104 mmol/L (ref 101–111)
CO2: 21 mmol/L — AB (ref 22–32)
CREATININE: 0.54 mg/dL — AB (ref 0.61–1.24)
Calcium: 9.2 mg/dL (ref 8.9–10.3)
GFR calc non Af Amer: >60 mL/min (ref >60)
Glucose, Bld: 102 mg/dL — ABNORMAL HIGH (ref 65–99)
POTASSIUM: 3.4 mmol/L — AB (ref 3.5–5.1)
SODIUM: 138 mmol/L (ref 135–145)

## 2016-07-11 LAB — CBC
HCT: 38.6 % — ABNORMAL LOW (ref 39.0–52.0)
Hemoglobin: 12.9 g/dL — ABNORMAL LOW (ref 13.0–17.0)
MCH: 28.3 pg (ref 26.0–34.0)
MCHC: 33.4 g/dL (ref 30.0–36.0)
MCV: 84.6 fL (ref 78.0–100.0)
PLATELETS: 279 10*3/uL (ref 150–400)
RBC: 4.56 MIL/uL (ref 4.22–5.81)
RDW: 13.1 % (ref 11.5–15.5)
WBC: 8.9 10*3/uL (ref 4.0–10.5)

## 2016-07-11 LAB — I-STAT TROPONIN, ED: TROPONIN I, POC: 0 ng/mL (ref 0.00–0.08)

## 2016-07-11 MED ORDER — KETOROLAC TROMETHAMINE 15 MG/ML IJ SOLN
15.0000 mg | Freq: Once | INTRAMUSCULAR | Status: AC
  Administered 2016-07-11: 15 mg via INTRAVENOUS
  Filled 2016-07-11: qty 1

## 2016-07-11 NOTE — ED Notes (Signed)
Pt provided with d/c instructions at this time. Pt verbalizes understanding of d/c instructions as well as follow up procedure after d/c. No new RX at time of d/c.  Pt in no apparent distress at this time. Pt ambulatory at time of d/c.   

## 2016-07-11 NOTE — ED Triage Notes (Signed)
Pt arrives to [facility] at this time via [facility] stretcher. Pt reports right sided chest pain that has been ongoing for "a few days."  Pain rated 4/10 at this time.   Chief Complaint  Patient presents with  . Chest Pain   Past Medical History:  Diagnosis Date  . Allergic rhinitis   . Anxiety and depression   . Eczema   . 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 Hypercholesteremia   . HYPERLIPIDEMIA 11/16/2010  . Insomnia   . Urolithiasis 07/22/2013

## 2016-07-11 NOTE — ED Provider Notes (Signed)
[facility]EMER[facility]Y DEPT Provider Note   CSN: [id number] Arrival date & time: 07/11/16  4098  By signing my name below, I, Aggie Moats, attest that this documentation has been prepared under the direction and in the presence of Shon Baton, MD. Electronically Signed: Aggie Moats, ED Scribe. 07/11/16. 3:40 AM.  History   Chief Complaint Chief Complaint  Patient presents with  . Chest Pain   The history is provided by the patient. No language interpreter was used.   HPI Comments:  ZORION NIMS is a 30 y.o. male-to-male transgender who has been on estrogen therapy, brought in by ambulance, who presents to the Emergency Department complaining of intermittent right sided chest pain, which started 2-3 days ago. Pain rated a 4/10 and is characterized as a tightness. Pt was seen on 06/27/16 for left-sided chest pain and shooting pain in bilateral calves and all labs came back normal. Associated symptoms include dry cough. She has taken Aspirin, with little relief. Denies edema in legs or recent travel. History of illegal silicone injections in cheeks and buttocks 7 years ago and she is concerned with possible migration.  Past Medical History:  Diagnosis Date  . Allergic rhinitis   . Anxiety and depression   . Eczema   . 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 Hypercholesteremia   . HYPERLIPIDEMIA 11/16/2010  . Insomnia   . Urolithiasis 07/22/2013    Patient Active Problem List   Diagnosis Date Noted  . PCP NOTES >>>>>>>>>>>>>>>>.. 03/22/2016  . Transgender 12/08/2015  . Urolithiasis 07/22/2013  . Annual physical exam 04/09/2012  . HYPERLIPIDEMIA 11/16/2010  . Anxiety state 05/06/2007  . Insomnia 05/06/2007  . ALLERGIC RHINITIS 05/02/2007  . ASTHMA 05/02/2007    Past Surgical History:  Procedure Laterality Date  . NO PAST SURGERIES      OB History    No data available       Home Medications     Prior to Admission medications   Medication Sig Start Date End Date Taking? Authorizing Provider  albuterol (VENTOLIN HFA) 108 (90 Base) MCG/ACT inhaler Inhale 2 puffs into the lungs every 6 (six) hours as needed for wheezing or shortness of breath. 03/22/16  Yes Wanda Plump, MD  ALPRAZolam Prudy Feeler) 0.5 MG tablet Take 1 tablet (0.5 mg total) by mouth 3 (three) times daily as needed for anxiety. 07/05/16  Yes Wanda Plump, MD  estradiol (ESTRACE) 2 MG tablet Take 2 mg by mouth 2 (two) times daily.   Yes Historical Provider, MD  pantoprazole (PROTONIX) 40 MG tablet Take 1 tablet (40 mg total) by mouth daily. 03/22/16  Yes Wanda Plump, MD  sertraline (ZOLOFT) 100 MG tablet Take 50 mg by mouth daily.    Yes Historical Provider, MD  spironolactone (ALDACTONE) 25 MG tablet Take 25 mg by mouth daily.   Yes Historical Provider, MD  traZODone (DESYREL) 50 MG tablet Take 1 tablet (50 mg total) by mouth at bedtime as needed for sleep. Patient not taking: Reported on 07/06/2016 02/22/16   Wanda Plump, MD    Family History Family History  Problem Relation Age of Onset  . Colon cancer Neg Hx   . Prostate cancer Neg Hx   . Diabetes Neg Hx   . Heart attack Neg Hx     Social History Social History  Substance Use Topics  . Smoking status: Former Smoker    Quit date: 04/13/2011  .  Smokeless tobacco: Never Used  . Alcohol use Yes     Comment: Socially     Allergies   Review of patient's allergies indicates no known allergies.   Review of Systems Review of Systems  Constitutional: Negative for fever.  Respiratory: Positive for cough. Negative for shortness of breath.   Cardiovascular: Positive for chest pain. Negative for leg swelling.  Gastrointestinal: Negative for nausea and vomiting.  All other systems reviewed and are negative.    Physical Exam Updated Vital Signs BP 126/82   Pulse 78   Temp 99.1 F (37.3 C) (Oral)   Resp 18   Ht 5\' 6"  (1.676 m)   Wt 180 lb (81.6 kg)   SpO2 99%   BMI  29.05 kg/m   Physical Exam  Constitutional: He is oriented to person, place, and time. He appears well-developed and well-nourished.  Feminine appearance  HENT:  Head: Normocephalic and atraumatic.  Eyes: EOM are normal. Pupils are equal, round, and reactive to light.  Neck: Neck supple.  Cardiovascular: Normal rate, regular rhythm and normal heart sounds.   No murmur heard. Pulmonary/Chest: Effort normal and breath sounds normal. No respiratory distress. He has no wheezes. He exhibits tenderness.  Abdominal: Soft. Bowel sounds are normal. There is no tenderness. There is no rebound.  Musculoskeletal: He exhibits no edema.  Neurological: He is alert and oriented to person, place, and time.  Skin: Skin is warm and dry.  Psychiatric: He has a normal mood and affect.  Nursing note and vitals reviewed.    ED Treatments / Results  DIAGNOSTIC STUDIES:  Oxygen Saturation is 98% on room air, normal by my interpretation.    COORDINATION OF CARE:  3:39 AM Discussed treatment plan with pt at bedside, which includes Toradol, chest x-ray and blood work, and pt agreed to plan.  Labs (all labs ordered are listed, but only abnormal results are displayed) Labs Reviewed  BASIC METABOLIC PANEL - Abnormal; Notable for the following:       Result Value   Potassium 3.4 (*)    CO2 21 (*)    Glucose, Bld 102 (*)    Creatinine, Ser 0.54 (*)    All other components within normal limits  CBC - Abnormal; Notable for the following:    Hemoglobin 12.9 (*)    HCT 38.6 (*)    All other components within normal limits  I-STAT TROPOININ, ED    EKG  EKG Interpretation None      ED ECG REPORT   Date: 07/11/2016  Rate: 89  Rhythm: normal sinus rhythm  QRS Axis: normal  Intervals: normal  ST/T Wave abnormalities: normal  Conduction Disutrbances:none  Narrative Interpretation:   Old EKG Reviewed: unchanged  I have personally reviewed the EKG tracing and agree with the computerized printout  as noted.   Radiology Dg Chest 2 View  Result Date: 07/11/2016 CLINICAL DATA:  Chest tightness, onset tonight EXAM: CHEST  2 VIEW COMPARISON:  03/16/2016 FINDINGS: The lungs are clear. The pulmonary vasculature is normal. Heart size is normal. Hilar and mediastinal contours are unremarkable. There is no pleural effusion. IMPRESSION: No active cardiopulmonary disease. Electronically Signed   By: Ellery Plunk M.D.   On: 07/11/2016 03:27    Procedures Procedures (including critical care time)  Medications Ordered in ED Medications  ketorolac (TORADOL) 15 MG/ML injection 15 mg (15 mg Intravenous Given 07/11/16 0350)     Initial Impression / Assessment and Plan / ED Course  I have reviewed the triage  vital signs and the nursing notes.  Pertinent labs & imaging results that were available during my care of the patient were reviewed by me and considered in my medical decision making (see chart for details).  Clinical Course  Value Comment By Time  ED EKG within 10 minutes (Reviewed) Shon Batonourtney F Kariel Skillman, MD 08/30 [phone]   Patient presents with chest pain. Ongoing for last several days. Nontoxic. Vital signs reassuring. Has had multiple evaluations in the last 6 months for the same. Most recent evaluation was 2 weeks ago. She has had for negative d-dimer's during that time. While she is on supplemental hormones, pain is atypical and feel she is low risk for PE. Will not repeat at this time. EKG, troponin, chest x-ray all reassuring. She does have reproducible tenderness on exam. She is given Toradol. Patient endorses that she may have some anxiety and just restarted her Zoloft. I have encouraged her to follow-up with her primary physician.  Final Clinical Impressions(s) / ED Diagnoses   Final diagnoses:  Other chest pain    New Prescriptions New Prescriptions   No medications on file   I personally performed the services described in this documentation, which was scribed in my presence.  The recorded information has been reviewed and is accurate.    Shon Batonourtney F Estelle Greenleaf, MD 07/11/16 [phone]

## 2016-07-11 NOTE — ED Notes (Signed)
Patient transported to X-ray at this time via ED stretcher.  Pt in no apparent distress at this time.   

## 2016-07-11 NOTE — ED Notes (Signed)
Patient is resting comfortably. Pt returned from radiology at this time. Pt in no apparent distress at this time.

## 2016-07-13 ENCOUNTER — Emergency Department (HOSPITAL_COMMUNITY)
Admission: EM | Admit: 2016-07-13 | Discharge: 2016-07-13 | Disposition: A | Discharge status: Home / Self Care | Payer: Self-pay | Attending: Emergency Medicine | Admitting: Emergency Medicine

## 2016-07-13 ENCOUNTER — Encounter (HOSPITAL_COMMUNITY): Payer: Self-pay

## 2016-07-13 ENCOUNTER — Encounter (HOSPITAL_COMMUNITY): Payer: Self-pay | Admitting: Emergency Medicine

## 2016-07-13 DIAGNOSIS — M791 Myalgia, unspecified site: Secondary | ICD-10-CM

## 2016-07-13 DIAGNOSIS — R0789 Other chest pain: Secondary | ICD-10-CM | POA: Insufficient documentation

## 2016-07-13 DIAGNOSIS — Z87891 Personal history of nicotine dependence: Secondary | ICD-10-CM | POA: Insufficient documentation

## 2016-07-13 DIAGNOSIS — Z79899 Other long term (current) drug therapy: Secondary | ICD-10-CM | POA: Insufficient documentation

## 2016-07-13 DIAGNOSIS — J45909 Unspecified asthma, uncomplicated: Secondary | ICD-10-CM | POA: Insufficient documentation

## 2016-07-13 DIAGNOSIS — R079 Chest pain, unspecified: Secondary | ICD-10-CM | POA: Insufficient documentation

## 2016-07-13 LAB — I-STAT CHEM 8, ED
BUN: 7 mg/dL (ref 6–20)
CALCIUM ION: 1.06 mmol/L — AB (ref 1.15–1.40)
CHLORIDE: 105 mmol/L (ref 101–111)
Creatinine, Ser: 0.5 mg/dL — ABNORMAL LOW (ref 0.61–1.24)
Glucose, Bld: 98 mg/dL (ref 65–99)
HCT: 37 % — ABNORMAL LOW (ref 39.0–52.0)
HEMOGLOBIN: 12.6 g/dL — AB (ref 13.0–17.0)
Potassium: 3.5 mmol/L (ref 3.5–5.1)
SODIUM: 138 mmol/L (ref 135–145)
TCO2: 21 mmol/L (ref 0–100)

## 2016-07-13 LAB — I-STAT TROPONIN, ED: TROPONIN I, POC: 0 ng/mL (ref 0.00–0.08)

## 2016-07-13 LAB — D-DIMER, QUANTITATIVE (NOT AT ARMC)

## 2016-07-13 NOTE — ED Triage Notes (Addendum)
Pt presents again for R sided chest pain.  Pt reports pain has been constant and radiates into R arm and R scapula.  Pt seen here x 2 for same, diagnosed with chest wall pain. Pt reports having silicone injections 7-8 years ago and is worried that "it's shifted", pt requesting CT scan of chest.

## 2016-07-13 NOTE — ED Triage Notes (Signed)
Pt arrived from home via [facility] pt states she has been having intermittent right chest tightness with no radiation since Tuesday. Pt was previously seen on Tuesday and d/c home. She states she had off market silicone injections in 2008-2009 and thinks that my be related to her pain.

## 2016-07-13 NOTE — Discharge Instructions (Signed)
Continue using over-the-counter pain medicines as needed.  Discuss further care with your primary care provider, and ask him if you should be referred to a plastic surgeon.

## 2016-07-13 NOTE — ED Provider Notes (Signed)
[facility]PT Provider Note   CSN: [id number] Arrival date & time: 07/13/16  1317     History   Chief Complaint No chief complaint on file.   HPI Ethan Harris is a 30 y.o. male.  She, a transgendered male to male, presents for ongoing complaint of concern for silicone shifting. She is concerned that the silicone injections in her buttocks, and other places, have shifted. She believes this has caused pain in her right upper chest. She has had several ED evaluations, and seen her PCP within the last 2 weeks for similar complaints. She denies fever chills nausea vomiting, shortness of breath or abdominal pain. She was last seen earlier today and had additional testing done then. She told the nurse that she wanted a CT scan, to evaluate for shifting silicone. There are no other known modifying factors.  HPI  Past Medical History:  Diagnosis Date  . Allergic rhinitis   . Anxiety and depression   . Eczema   . 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 Hypercholesteremia   . HYPERLIPIDEMIA 11/16/2010  . Insomnia   . Urolithiasis 07/22/2013    Patient Active Problem List   Diagnosis Date Noted  . PCP NOTES >>>>>>>>>>>>>>>>.. 03/22/2016  . Transgender 12/08/2015  . Urolithiasis 07/22/2013  . Annual physical exam 04/09/2012  . HYPERLIPIDEMIA 11/16/2010  . Anxiety state 05/06/2007  . Insomnia 05/06/2007  . ALLERGIC RHINITIS 05/02/2007  . ASTHMA 05/02/2007    Past Surgical History:  Procedure Laterality Date  . NO PAST SURGERIES      OB History    No data available       Home Medications    Prior to Admission medications   Medication Sig Start Date End Date Taking? Authorizing Provider  albuterol (VENTOLIN HFA) 108 (90 Base) MCG/ACT inhaler Inhale 2 puffs into the lungs every 6 (six) hours as needed for wheezing or shortness of breath. 03/22/16  Yes Wanda PlumpJose E Paz, MD  ALPRAZolam Prudy Feeler(XANAX) 0.5 MG  tablet Take 1 tablet (0.5 mg total) by mouth 3 (three) times daily as needed for anxiety. 07/05/16  Yes Wanda PlumpJose E Paz, MD  estradiol (ESTRACE) 2 MG tablet Take 2 mg by mouth 2 (two) times daily.   Yes Historical Provider, MD  pantoprazole (PROTONIX) 40 MG tablet Take 1 tablet (40 mg total) by mouth daily. 03/22/16  Yes Wanda PlumpJose E Paz, MD  sertraline (ZOLOFT) 100 MG tablet Take 50 mg by mouth daily.    Yes Historical Provider, MD  spironolactone (ALDACTONE) 25 MG tablet Take 25 mg by mouth daily.   Yes Historical Provider, MD  traZODone (DESYREL) 50 MG tablet Take 1 tablet (50 mg total) by mouth at bedtime as needed for sleep. Patient not taking: Reported on 07/06/2016 02/22/16   Wanda PlumpJose E Paz, MD    Family History Family History  Problem Relation Age of Onset  . Colon cancer Neg Hx   . Prostate cancer Neg Hx   . Diabetes Neg Hx   . Heart attack Neg Hx     Social History Social History  Substance Use Topics  . Smoking status: Former Smoker    Quit date: 04/13/2011  . Smokeless tobacco: Never Used  . Alcohol use Yes     Comment: Socially     Allergies   Review of patient's allergies indicates no known allergies.   Review of Systems Review of Systems  All other systems reviewed and are  negative.    Physical Exam Updated Vital Signs BP 120/69   Pulse 64   Temp 98.6 F (37 C) (Oral)   Resp 18   Ht 5\' 5"  (1.651 m)   Wt 180 lb (81.6 kg)   SpO2 100%   BMI 29.95 kg/m   Physical Exam  Constitutional: He is oriented to person, place, and time. He appears well-developed and well-nourished.  HENT:  Head: Normocephalic and atraumatic.  Right Ear: External ear normal.  Left Ear: External ear normal.  Eyes: Conjunctivae and EOM are normal. Pupils are equal, round, and reactive to light.  Neck: Normal range of motion and phonation normal. Neck supple.  Cardiovascular: Normal rate, regular rhythm and normal heart sounds.   Pulmonary/Chest: Effort normal and breath sounds normal. No  respiratory distress. He exhibits no bony tenderness.  Musculoskeletal: Normal range of motion.  Neurological: He is alert and oriented to person, place, and time. Abnormal reflex: .ewed. No cranial nerve deficit or sensory deficit. He exhibits normal muscle tone. Coordination normal.  Skin: Skin is warm, dry and intact.  Psychiatric: He has a normal mood and affect. His behavior is normal. Judgment and thought content normal.  Nursing note and vitals reviewed.    ED Treatments / Results  Labs (all labs ordered are listed, but only abnormal results are displayed) Labs Reviewed - No data to display  EKG  EKG Interpretation None       Radiology No results found.  Procedures Procedures (including critical care time)  Medications Ordered in ED Medications - No data to display      Initial Impression / Assessment and Plan / ED Course  I have reviewed the triage vital signs and the nursing notes.  Pertinent labs & imaging results that were available during my care of the patient were reviewed by me and considered in my medical decision making (see chart for details).  Clinical Course    Medications - No data to display  Patient Vitals for the past 24 hrs:  BP Temp Temp src Pulse Resp SpO2 Height Weight  07/13/16 1507 120/69 - - 64 - 100 % - -  07/13/16 1319 117/76 98.6 F (37 C) Oral 69 18 99 % 5\' 5"  (1.651 m) 180 lb (81.6 kg)    Discharge- Reevaluation with update and discussion. After initial assessment and treatment, an updated evaluation reveals no change in clinical status. Findings discussed with patient, all questions answered. [name] L    Final Clinical Impressions(s) / ED Diagnoses   Final diagnoses:  Myalgia    Nonspecific chest discomfort, concern on patient's part for silicone reaction complication. Suspect early infection metabolic instability or pending vascular collapse.  Nursing Notes Reviewed/ Care Coordinated Applicable Imaging  Reviewed Interpretation of Laboratory Data incorporated into ED treatment  The patient appears reasonably screened and/or stabilized for discharge and I doubt any other medical condition or other [facility] requiring further screening, evaluation, or treatment in the ED at this time prior to discharge.  Plan: Home Medications- continue; Home Treatments- rest; return here if the recommended treatment, does not improve the symptoms; Recommended follow up- PCP, possible referral to Plastic Surgery   New Prescriptions Discharge Medication List as of 07/13/2016  3:23 PM       Mancel Bale, MD 07/13/16 [phone]

## 2016-07-13 NOTE — ED Provider Notes (Signed)
[facility]-EMERGENCY DEPT Provider Note   CSN: [id number] Arrival date & time: 07/13/16  0132  By signing my name below, I, Ethan Harris, attest that this documentation has been prepared under the direction and in the presence of physician practitioner, Gilda Crease, MD. Electronically Signed: Linna Harris, Scribe. 07/13/2016. 1:58 AM.  History   Chief Complaint Chief Complaint  Patient presents with  . Chest Pain    The history is provided by the patient. No language interpreter was used.     HPI Comments: Ethan Harris is a 30 y.o. male-to-male transgender who has been on estrogen therapy, brought in by EMS, who presents to the Emergency Department complaining of sudden onset, constant, right-sided chest tightness beginning shortly PTA. She endorses associated chest pain and SOB as well. Pt has been seen here multiple times in the past for the same symptoms. She is worried that her symptoms are related to silicone injections that she received 8-9 years ago. She denies numbness, weakness, or any other associated symptoms. Pt notes she has an appointment with her PCP in a few days for her current symptoms.  Past Medical History:  Diagnosis Date  . Allergic rhinitis   . Anxiety and depression   . Eczema   . 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 Hypercholesteremia   . HYPERLIPIDEMIA 11/16/2010  . Insomnia   . Urolithiasis 07/22/2013    Patient Active Problem List   Diagnosis Date Noted  . PCP NOTES >>>>>>>>>>>>>>>>.. 03/22/2016  . Transgender 12/08/2015  . Urolithiasis 07/22/2013  . Annual physical exam 04/09/2012  . HYPERLIPIDEMIA 11/16/2010  . Anxiety state 05/06/2007  . Insomnia 05/06/2007  . ALLERGIC RHINITIS 05/02/2007  . ASTHMA 05/02/2007    Past Surgical History:  Procedure Laterality Date  . NO PAST SURGERIES      OB History    No data available       Home Medications     Prior to Admission medications   Medication Sig Start Date End Date Taking? Authorizing Provider  albuterol (VENTOLIN HFA) 108 (90 Base) MCG/ACT inhaler Inhale 2 puffs into the lungs every 6 (six) hours as needed for wheezing or shortness of breath. 03/22/16  Yes Wanda Plump, MD  ALPRAZolam Prudy Feeler) 0.5 MG tablet Take 1 tablet (0.5 mg total) by mouth 3 (three) times daily as needed for anxiety. 07/05/16  Yes Wanda Plump, MD  estradiol (ESTRACE) 2 MG tablet Take 2 mg by mouth 2 (two) times daily.   Yes Historical Provider, MD  pantoprazole (PROTONIX) 40 MG tablet Take 1 tablet (40 mg total) by mouth daily. 03/22/16  Yes Wanda Plump, MD  sertraline (ZOLOFT) 100 MG tablet Take 50 mg by mouth daily.    Yes Historical Provider, MD  spironolactone (ALDACTONE) 25 MG tablet Take 25 mg by mouth daily.   Yes Historical Provider, MD  traZODone (DESYREL) 50 MG tablet Take 1 tablet (50 mg total) by mouth at bedtime as needed for sleep. Patient not taking: Reported on 07/06/2016 02/22/16   Wanda Plump, MD    Family History Family History  Problem Relation Age of Onset  . Colon cancer Neg Hx   . Prostate cancer Neg Hx   . Diabetes Neg Hx   . Heart attack Neg Hx     Social History Social History  Substance Use Topics  . Smoking status: Former Smoker    Quit date: 04/13/2011  . Smokeless  tobacco: Never Used  . Alcohol use Yes     Comment: Socially     Allergies   Review of patient's allergies indicates no known allergies.   Review of Systems Review of Systems  Respiratory: Positive for chest tightness and shortness of breath.   Cardiovascular: Positive for chest pain.  Neurological: Negative for weakness and numbness.  All other systems reviewed and are negative.    Physical Exam Updated Vital Signs BP 125/80   Pulse 71   Resp 24   Ht 5\' 6"  (1.676 m)   Wt 180 lb (81.6 kg)   SpO2 100%   BMI 29.05 kg/m   Physical Exam  Constitutional: He is oriented to person, place, and time. He  appears well-developed and well-nourished. No distress.  HENT:  Head: Normocephalic and atraumatic.  Right Ear: Hearing normal.  Left Ear: Hearing normal.  Nose: Nose normal.  Mouth/Throat: Oropharynx is clear and moist and mucous membranes are normal.  Eyes: Conjunctivae and EOM are normal. Pupils are equal, round, and reactive to light.  Neck: Normal range of motion. Neck supple.  Cardiovascular: Regular rhythm, S1 normal and S2 normal.  Exam reveals no gallop and no friction rub.   No murmur heard. Pulmonary/Chest: Effort normal and breath sounds normal. No respiratory distress. He exhibits no tenderness.  Abdominal: Soft. Normal appearance and bowel sounds are normal. There is no hepatosplenomegaly. There is no tenderness. There is no rebound, no guarding, no tenderness at McBurney's point and negative Murphy's sign. No hernia.  Musculoskeletal: Normal range of motion.  Neurological: He is alert and oriented to person, place, and time. He has normal strength. No cranial nerve deficit or sensory deficit. Coordination normal. GCS eye subscore is 4. GCS verbal subscore is 5. GCS motor subscore is 6.  Skin: Skin is warm, dry and intact. No rash noted. No cyanosis.  Psychiatric: He has a normal mood and affect. His speech is normal and behavior is normal. Thought content normal.  Nursing note and vitals reviewed.   ED Treatments / Results  Labs (all labs ordered are listed, but only abnormal results are displayed) Labs Reviewed - No data to display  EKG  EKG Interpretation None       Radiology Dg Chest 2 View  Result Date: 07/11/2016 CLINICAL DATA:  Chest tightness, onset tonight EXAM: CHEST  2 VIEW COMPARISON:  03/16/2016 FINDINGS: The lungs are clear. The pulmonary vasculature is normal. Heart size is normal. Hilar and mediastinal contours are unremarkable. There is no pleural effusion. IMPRESSION: No active cardiopulmonary disease. Electronically Signed   By: Ellery Plunk  M.D.   On: 07/11/2016 03:27    Procedures Procedures (including critical care time)  DIAGNOSTIC STUDIES: Oxygen Saturation is 100% on RA, normal by my interpretation.    COORDINATION OF CARE: 1:58 AM Discussed treatment plan with pt at bedside and pt agreed to plan.  Medications Ordered in ED Medications - No data to display   Initial Impression / Assessment and Plan / ED Course  I have reviewed the triage vital signs and the nursing notes.  Pertinent labs & imaging results that were available during my care of the patient were reviewed by me and considered in my medical decision making (see chart for details).  Clinical Course    Patient returns to the emergency department, once again, with complaints of chest discomfort. Patient has been seen multiple times in this ER for similar complaints. She was seen last night and reassured but returns today. Patient  is convinced that she has migration of silicone that she had injected into her 7 years ago causing her symptoms. She was requesting CT scan and other tests that she had seen online after Googling silicone injections.   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 She does, however, use estrogen replacement and therefore PE is considered a possibility although felt unlikely based on her vital signs. D-dimer is once again normal. I did tell the patient that she does not require a CAT scan emergently in the emergency department. She needs to follow-up with her primary doctor and he may opt to perform some of the studies she wishes as an outpatient, but there is no emergent basis for this.  I personally performed the services described in this documentation, which was scribed in my presence. The recorded information has been reviewed and is accurate.   Final Clinical Impressions(s) / ED Diagnoses   Final diagnoses:  None  chest pain   New Prescriptions New  Prescriptions   No medications on file     Gilda Creasehristopher J Cearra Portnoy, MD 07/13/16 0430

## 2016-07-17 ENCOUNTER — Ambulatory Visit (INDEPENDENT_AMBULATORY_CARE_PROVIDER_SITE_OTHER): Payer: Self-pay | Admitting: Internal Medicine

## 2016-07-17 ENCOUNTER — Encounter: Payer: Self-pay | Admitting: Internal Medicine

## 2016-07-17 VITALS — BP 118/78 | HR 67 | Temp 98.6°F | Ht 65.0 in | Wt 173.0 lb

## 2016-07-17 DIAGNOSIS — F411 Generalized anxiety disorder: Secondary | ICD-10-CM

## 2016-07-17 DIAGNOSIS — R0789 Other chest pain: Secondary | ICD-10-CM

## 2016-07-17 NOTE — Assessment & Plan Note (Signed)
Chest pain: As described above, pt concerned about silicone migration, literature review regards silicone complications, most of the problems are local no distal. At this point we agree on simply observation Anxiety: See previous entry, did not restart Zoloft, encouraged to do. RTC as a schedule October for CPX

## 2016-07-17 NOTE — Progress Notes (Signed)
Subjective:    Patient ID: Ethan Harris, male    DOB: [birth year], 30 y.o.   MRN: [id number]  DOS:  07/17/2016 Type of visit - description : Follow-up Interval history: Since the last office visit with me 07/06/2016 has been in the ER 3 times: 07/11/2016: Chest pain, workup was negative. Rx Toradol. Sx d/t to anxiety? 07/13/2016: ER eval , arrived via  EMS with right-sided chest pain, patient concerned about silicone migration (previously injected at buttocks and other places) , patient requested a CT, d-dimer was negative 07/13/2016 (few hours later), had similar complaints, was released home.  Review of Systems  at this point, she is feeling better, chest pain is essentially resolved. Denies fever chills. No difficulty breathing or cough Occasionally has palpitations, pt is   sure is due to anxiety.  Past Medical History:  Diagnosis Date  . Allergic rhinitis   . Anxiety and depression   . Eczema   . 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 Hypercholesteremia   . HYPERLIPIDEMIA 11/16/2010  . Insomnia   . Urolithiasis 07/22/2013    Past Surgical History:  Procedure Laterality Date  . NO PAST SURGERIES      Social History   Social History  . Marital status: Single    Spouse name: N/A  . Number of children: 0  . Years of education: N/A   Occupational History  . entertainer     Social History Main Topics  . Smoking status: Former Smoker    Quit date: 04/13/2011  . Smokeless tobacco: Never Used  . Alcohol use Yes     Comment: Socially  . Drug use: No  . Sexual activity: Yes   Other Topics Concern  . Not on file   Social History Narrative   Lives w/ 2 room mates    (mom- Jennifer [place])             Medication List       Accurate as of 07/17/16  6:12 PM. Always use your most recent med list.          albuterol 108 (90 Base) MCG/ACT inhaler Commonly known as:  VENTOLIN HFA Inhale 2  puffs into the lungs every 6 (six) hours as needed for wheezing or shortness of breath.   ALPRAZolam 0.5 MG tablet Commonly known as:  XANAX Take 1 tablet (0.5 mg total) by mouth 3 (three) times daily as needed for anxiety.   estradiol 2 MG tablet Commonly known as:  ESTRACE Take 2 mg by mouth 2 (two) times daily.   pantoprazole 40 MG tablet Commonly known as:  PROTONIX Take 1 tablet (40 mg total) by mouth daily.   sertraline 100 MG tablet Commonly known as:  ZOLOFT Take 50 mg by mouth daily.   spironolactone 25 MG tablet Commonly known as:  ALDACTONE Take 25 mg by mouth daily.   traZODone 50 MG tablet Commonly known as:  DESYREL Take 1 tablet (50 mg total) by mouth at bedtime as needed for sleep.          Objective:   Physical Exam BP 118/78 (BP Location: Left Arm, Patient Position: Sitting, Cuff Size: Normal)   Pulse 67   Temp 98.6 F (37 C) (Oral)   Ht 5\' 5"  (1.651 m)   Wt 173 lb (78.5 kg)   SpO2 99%   BMI 28.79 kg/m  General:   Well developed, well nourished . NAD.  HEENT:  Normocephalic . Face symmetric, atraumatic Lungs:  CTA B Normal respiratory effort, no intercostal retractions, no accessory muscle use. Heart: RRR,  no murmur.  No pretibial edema bilaterally  Skin: Not pale. Not jaundice Neurologic:  alert & oriented X3.  Speech normal, gait appropriate for age and unassisted Psych--  Cognition and judgment appear intact.  Cooperative with normal attention span and concentration.  Behavior appropriate. No anxious or depressed appearing.      Assessment & Plan:   Assessment  Hyperlipidemia Anxiety, depression, insomnia  Transgender male to male  --as of 06-2016 on spironolactone, estrogen (rx by gyn , Dr Ruby Cola) --Silicone injections- buttocks, hips, cheeks (betwen ~ 2004 to 2009) H/o asthma H/o Urolithiasis 2014  PLAN:  Chest pain: As described above, pt concerned about silicone migration, literature review regards silicone  complications, most of the problems are local no distal. At this point we agree on simply observation Anxiety: See previous entry, did not restart Zoloft, encouraged to do. RTC as a schedule October for CPX

## 2016-07-17 NOTE — Progress Notes (Signed)
Pre visit review using our clinic review tool, if applicable. No additional management support is needed unless otherwise documented below in the visit note. 

## 2016-07-17 NOTE — Patient Instructions (Signed)
Restart zoloft as previously indicated   Come back as schedule

## 2016-07-24 ENCOUNTER — Other Ambulatory Visit: Payer: Self-pay | Admitting: Internal Medicine

## 2016-07-30 ENCOUNTER — Other Ambulatory Visit: Payer: Self-pay | Admitting: Internal Medicine

## 2016-07-30 NOTE — Telephone Encounter (Signed)
Pt is requesting refill on Trazodone.  Last OV: 07/17/2016 Last Fill: 02/22/2016 #30 and 0RF  Please advise.

## 2016-07-30 NOTE — Telephone Encounter (Signed)
Rx sent 

## 2016-07-30 NOTE — Telephone Encounter (Signed)
Okay #30 and one refill 

## 2016-08-07 ENCOUNTER — Other Ambulatory Visit: Payer: Self-pay | Admitting: Internal Medicine

## 2016-08-07 NOTE — Telephone Encounter (Signed)
Rx printed, awaiting MD signature.  

## 2016-08-07 NOTE — Telephone Encounter (Signed)
Okay #90, one refill 

## 2016-08-07 NOTE — Telephone Encounter (Signed)
Rx faxed to [facility].  

## 2016-08-07 NOTE — Telephone Encounter (Signed)
Pt is requesting refill on Alprazolam.  Last OV: 07/17/2016 Last Fill: 07/05/2016 #90 and 0RF Pt sig: 1 tab TID PRN UDS: 12/07/2015 Low risk  Please advise.

## 2016-08-21 ENCOUNTER — Ambulatory Visit (INDEPENDENT_AMBULATORY_CARE_PROVIDER_SITE_OTHER): Payer: Self-pay | Admitting: Internal Medicine

## 2016-08-21 ENCOUNTER — Encounter: Payer: Self-pay | Admitting: Internal Medicine

## 2016-08-21 VITALS — BP 108/68 | HR 83 | Temp 98.2°F | Resp 14 | Ht 65.0 in | Wt 169.4 lb

## 2016-08-21 DIAGNOSIS — Z0000 Encounter for general adult medical examination without abnormal findings: Secondary | ICD-10-CM

## 2016-08-21 MED ORDER — HYDROCORTISONE 2.5 % EX CREA: TOPICAL_CREAM | Freq: Two times a day (BID) | CUTANEOUS | 1 refills | Status: AC

## 2016-08-21 NOTE — Assessment & Plan Note (Addendum)
Td 2013 , declined to shot. Previous  labs reviewed, will get a FLP, CBC, TSH, HIV, RPR Counseled: exercise, diet, STE   , safe sex

## 2016-08-21 NOTE — Patient Instructions (Signed)
GO TO THE LAB : Get the blood work     GO TO THE FRONT DESK Schedule your next appointment for a  checkup in 6 months  Apply hydrocortisone cream twice a day for 10 days, then as needed   Testicular Self-Exam A self-examination of your testicles involves looking at and feeling your testicles for abnormal lumps or swelling. Several things can cause swelling, lumps, or pain in your testicles. Some of these causes are:  Injuries.  Inflammation.  Infection.  Accumulation of fluids around your testicle (hydrocele).  Twisted testicles (testicular torsion).  Testicular cancer. Self-examination of the testicles and groin areas may be advised if you are at risk for testicular cancer. Risks for testicular cancer include:  An undescended testicle (cryptorchidism).  A history of previous testicular cancer.  A family history of testicular cancer. The testicles are easiest to examine after warm baths or showers and are more difficult to examine when you are cold. This is because the muscles attached to the testicles retract and pull them up higher or into the abdomen. Follow these steps while you are standing:  Hold your penis away from your body.  Roll one testicle between your thumb and forefinger, feeling the entire testicle.  Roll the other testicle between your thumb and forefinger, feeling the entire testicle. Feel for lumps, swelling, or discomfort. A normal testicle is egg shaped and feels firm. It is smooth and not tender. The spermatic cord can be felt as a firm spaghetti-like cord at the back of your testicle. It is also important to examine the crease between the front of your leg and your abdomen. Feel for any bumps that are tender. These could be enlarged lymph nodes.    This information is not intended to replace advice given to you by your health care provider. Make sure you discuss any questions you have with your health care provider.   Document Released: 02/04/2001  Document Revised: 07/01/2013 Document Reviewed: 04/20/2013 Elsevier Interactive Patient Education 2016 [employer].     Safe Sex Safe sex is about reducing the risk of giving or getting a sexually transmitted disease (STD). STDs are spread through sexual contact involving the genitals, mouth, or rectum. Some STDs can be cured and others cannot. Safe sex can also prevent unintended pregnancies.  WHAT ARE SOME SAFE SEX PRACTICES?  Limit your sexual activity to only one partner who is having sex with only you.  Talk to your partner about his or her past partners, past STDs, and drug use.  Use a condom every time you have sexual intercourse. This includes vaginal, oral, and anal sexual activity. Both females and males should wear condoms during oral sex. Only use latex or polyurethane condoms and water-based lubricants. Using petroleum-based lubricants or oils to lubricate a condom will weaken the condom and increase the chance that it will break. The condom should be in place from the beginning to the end of sexual activity. Wearing a condom reduces, but does not completely eliminate, your risk of getting or giving an STD. STDs can be spread by contact with infected body fluids and skin.  Get vaccinated for hepatitis B and HPV.  Avoid alcohol and recreational drugs, which can affect your judgment. You may forget to use a condom or participate in high-risk sex.  For females, avoid douching after sexual intercourse. Douching can spread an infection farther into the reproductive tract.  Check your body for signs of sores, blisters, rashes, or unusual discharge. See your health care  provider if you notice any of these signs.  Avoid sexual contact if you have symptoms of an infection or are being treated for an STD. If you or your partner has herpes, avoid sexual contact when blisters are present. Use condoms at all other times.  If you are at risk of being infected with HIV, it is recommended  that you take a prescription medicine daily to prevent HIV infection. This is called pre-exposure prophylaxis (PrEP). You are considered at risk if:  You are a man who has sex with other men (MSM).  You are a heterosexual man or woman who is sexually active with more than one partner.  You take drugs by injection.  You are sexually active with a partner who has HIV.  Talk with your health care provider about whether you are at high risk of being infected with HIV. If you choose to begin PrEP, you should first be tested for HIV. You should then be tested every 3 months for as long as you are taking PrEP.  See your health care provider for regular screenings, exams, and tests for other STDs. Before having sex with a new partner, each of you should be screened for STDs and should talk about the results with each other. WHAT ARE THE BENEFITS OF SAFE SEX?   There is less chance of getting or giving an STD.  You can prevent unwanted or unintended pregnancies.  By discussing safe sex concerns with your partner, you may increase feelings of intimacy, comfort, trust, and honesty between the two of you.   This information is not intended to replace advice given to you by your health care provider. Make sure you discuss any questions you have with your health care provider.   Document Released: 12/06/2004 Document Revised: 11/19/2014 Document Reviewed: 04/21/2012 Elsevier Interactive Patient Education [employer]2016 Elsevier Inc.

## 2016-08-21 NOTE — Progress Notes (Signed)
Pre visit review using our clinic review tool, if applicable. No additional management support is needed unless otherwise documented below in the visit note. 

## 2016-08-21 NOTE — Progress Notes (Signed)
Subjective:    Patient ID: Ethan Harris, male    DOB: 21-Mar-1986, 30 y.o.   MRN: [id number]  DOS:  08/21/2016 Type of visit - description : cpx, here w/ a friend Interval history:Good medication compliance    Review of Systems Constitutional: No fever. No chills. No unexplained wt changes. No unusual sweats  HEENT: No dental problems, no ear discharge, no facial swelling, no voice changes. No eye discharge, no eye  redness , no  intolerance to light   Respiratory: No wheezing , no  difficulty breathing. No cough , no mucus production  Cardiovascular: No CP, no leg swelling , no  Palpitations  GI: no nausea, no vomiting, no diarrhea , no  abdominal pain.  No blood in the stools. No dysphagia, no odynophagia    Endocrine: No polyphagia, no polyuria , no polydipsia  GU: No dysuria, gross hematuria, difficulty urinating. No urinary urgency, no frequency.  Musculoskeletal: Occasional pain at the buttocks, had local injections there, granulomas?  Skin: Long history of eczema, currently exacerbation located @ the  chest bilaterally. OTCs not helping.  Allergic, immunologic: No environmental allergies , no  food allergies  Neurological: No dizziness no  syncope. No headaches. No diplopia, no slurred, no slurred speech, no motor deficits, no facial  Numbness  Hematological: No enlarged lymph nodes, no easy bruising , no unusual bleedings  Psychiatry: No suicidal ideas, no hallucinations, no beavior problems, no confusion.  No unusual/severe anxiety, no depression   Past Medical History:  Diagnosis Date  . Allergic rhinitis   . Anxiety and depression   . Eczema   . 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 Hypercholesteremia   . HYPERLIPIDEMIA 11/16/2010  . Insomnia   . Urolithiasis 07/22/2013    Past Surgical History:  Procedure Laterality Date  . NO PAST SURGERIES      Social History   Social  History  . Marital status: Single    Spouse name: N/A  . Number of children: 0  . Years of education: N/A   Occupational History  . entertainer     Social History Main Topics  . Smoking status: Former Smoker    Quit date: 04/13/2011  . Smokeless tobacco: Never Used  . Alcohol use Yes     Comment: Socially  . Drug use: No  . Sexual activity: Yes   Other Topics Concern  . Not on file   Social History Narrative   Lives w/ 2 room mates    (mom- Jennifer [place])          Family History  Problem Relation Age of Onset  . Colon cancer Neg Hx   . Prostate cancer Neg Hx   . Diabetes Neg Hx   . Heart attack Neg Hx       Medication List       Accurate as of 08/21/16  3:17 PM. Always use your most recent med list.          albuterol 108 (90 Base) MCG/ACT inhaler Commonly known as:  VENTOLIN HFA Inhale 2 puffs into the lungs every 6 (six) hours as needed for wheezing or shortness of breath.   ALPRAZolam 0.5 MG tablet Commonly known as:  XANAX Take 1 tablet (0.5 mg total) by mouth 3 (three) times daily as needed for anxiety.   estradiol 2 MG tablet Commonly known as:  ESTRACE Take 2 mg by mouth 2 (two) times  daily.   pantoprazole 40 MG tablet Commonly known as:  PROTONIX Take 1 tablet (40 mg total) by mouth daily.   sertraline 100 MG tablet Commonly known as:  ZOLOFT Take 100 mg by mouth daily.   spironolactone 25 MG tablet Commonly known as:  ALDACTONE Take 25 mg by mouth daily.   traZODone 50 MG tablet Commonly known as:  DESYREL Take 1 tablet (50 mg total) by mouth at bedtime as needed for sleep.          Objective:   Physical Exam  Skin:      BP 108/68 (BP Location: Left Arm, Patient Position: Sitting, Cuff Size: Normal)   Pulse 83   Temp 98.2 F (36.8 C) (Oral)   Resp 14   Ht 5\' 5"  (1.651 m)   Wt 169 lb 6 oz (76.8 kg)   SpO2 97%   BMI 28.19 kg/m   General:   Well developed, well nourished . NAD.  Neck: No  thyromegaly  HEENT:    Normocephalic . Face symmetric, atraumatic Lungs:  CTA B Normal respiratory effort, no intercostal retractions, no accessory muscle use. Heart: RRR,  no murmur.  No pretibial edema bilaterally  Abdomen:  Not distended, soft, non-tender. No rebound or rigidity.   Neurologic:  alert & oriented X3.  Speech normal, gait appropriate for age and unassisted Strength symmetric and appropriate for age.  Psych: Cognition and judgment appear intact.  Cooperative with normal attention span and concentration.  Behavior appropriate. No anxious or depressed appearing.    Assessment & Plan:   Assessment  Hyperlipidemia Anxiety, depression, insomnia  Transgender male to male  --as of 06-2016 on spironolactone, estrogen (rx by gyn , Dr Ruby ColaPittaway) --Silicone injections- buttocks, hips, cheeks (betwen ~ 2004 to 2009) H/o asthma H/o Urolithiasis 2014  PLAN: Hyperlipidemia: Diet control, check labs Anxiety, depression, insomnia: Sx well-controlled now on Zoloft 100 mg, takes Xanax as needed on average 2 times a day. 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 Ruby ColaPittaway, wonders if he has granulomas in the buttocks when he had local injections, he is already planning to see a Engineer, petroleumplastic surgeon. No fever or chills. Eczema: Chronic on and off problem, currently in the chest, will send a topical steroid . RTC 6 months

## 2016-08-22 LAB — CBC WITH DIFFERENTIAL/PLATELET
BASOS PCT: 1 % (ref 0.0–3.0)
Basophils Absolute: 0.1 10*3/uL (ref 0.0–0.1)
EOS ABS: 0.1 10*3/uL (ref 0.0–0.7)
Eosinophils Relative: 1.7 % (ref 0.0–5.0)
HEMATOCRIT: 40 % (ref 39.0–52.0)
HEMOGLOBIN: 13.4 g/dL (ref 13.0–17.0)
LYMPHS PCT: 30.1 % (ref 12.0–46.0)
Lymphs Abs: 2.2 10*3/uL (ref 0.7–4.0)
MCHC: 33.6 g/dL (ref 30.0–36.0)
MCV: 86.7 fl (ref 78.0–100.0)
MONOS PCT: 7 % (ref 3.0–12.0)
Monocytes Absolute: 0.5 10*3/uL (ref 0.1–1.0)
NEUTROS ABS: 4.4 10*3/uL (ref 1.4–7.7)
Neutrophils Relative %: 60.2 % (ref 43.0–77.0)
PLATELETS: 259 10*3/uL (ref 150.0–400.0)
RBC: 4.61 Mil/uL (ref 4.22–5.81)
RDW: 13.3 % (ref 11.5–15.5)
WBC: 7.4 10*3/uL (ref 4.0–10.5)

## 2016-08-22 LAB — LIPID PANEL
CHOLESTEROL: 168 mg/dL (ref 0–200)
HDL: 33.3 mg/dL — ABNORMAL LOW (ref >39.00)
LDL Cholesterol: 109 mg/dL — ABNORMAL HIGH (ref 0–99)
NonHDL: 134.55
TRIGLYCERIDES: 129 mg/dL (ref 0.0–149.0)
Total CHOL/HDL Ratio: 5
VLDL: 25.8 mg/dL (ref 0.0–40.0)

## 2016-08-22 LAB — TSH: TSH: 1.42 u[IU]/mL (ref 0.35–4.50)

## 2016-08-22 LAB — HIV ANTIBODY (ROUTINE TESTING W REFLEX): HIV: NONREACTIVE

## 2016-08-22 LAB — RPR

## 2016-08-22 NOTE — Assessment & Plan Note (Signed)
Hyperlipidemia: Diet control, check labs Anxiety, depression, insomnia: Sx well-controlled now on Zoloft 100 mg, takes Xanax as needed on average 2 times a day. 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 Ruby ColaPittaway, wonders if he has granulomas in the buttocks when he had local injections, he is already planning to see a Engineer, petroleumplastic surgeon. No fever or chills. Eczema: Chronic on and off problem, currently in the chest, will send a topical steroid . RTC 6 months

## 2016-08-30 ENCOUNTER — Telehealth: Payer: Self-pay

## 2016-08-30 NOTE — Telephone Encounter (Signed)
UDS: 08/21/2016  Positive for Alprazolam    Low risk per Dr. Drue NovelPaz 08/30/2016

## 2016-11-04 ENCOUNTER — Other Ambulatory Visit: Payer: Self-pay | Admitting: Internal Medicine

## 2016-11-06 NOTE — Telephone Encounter (Signed)
Pt is requesting refill on Alprazolam.  Last OV: 08/21/2016  Last Fill: 08/07/2016 #90 and 1RF (Pt sig: 1 tab TID PRN) UDS: 08/21/2016 Low risk   Please advise.

## 2016-11-06 NOTE — Telephone Encounter (Signed)
Rx faxed to [facility].  

## 2016-11-06 NOTE — Telephone Encounter (Signed)
Rx printed, awaiting MD signature.  

## 2016-11-06 NOTE — Telephone Encounter (Signed)
90 and 1 RF 

## 2017-01-27 ENCOUNTER — Other Ambulatory Visit: Payer: Self-pay | Admitting: Internal Medicine

## 2017-02-07 ENCOUNTER — Other Ambulatory Visit: Payer: Self-pay | Admitting: Internal Medicine

## 2017-02-11 ENCOUNTER — Other Ambulatory Visit: Payer: Self-pay

## 2017-02-11 NOTE — Telephone Encounter (Signed)
Rx printed, awaiting MD signature.  

## 2017-02-11 NOTE — Telephone Encounter (Signed)
Rx faxed to [facility].  

## 2017-02-11 NOTE — Telephone Encounter (Signed)
Okay #90, no refills 

## 2017-02-11 NOTE — Telephone Encounter (Signed)
Pt is requesting refill on Alprazolam.  Last OV: 08/21/2016 Last Fill: 11/06/2016 #90 and 1RF (Pt sig: 1 tab TID PRN) UDS: 08/21/2016 Low risk  Please advise.

## 2017-02-19 ENCOUNTER — Ambulatory Visit: Payer: Self-pay | Admitting: Internal Medicine

## 2017-03-13 ENCOUNTER — Ambulatory Visit (INDEPENDENT_AMBULATORY_CARE_PROVIDER_SITE_OTHER): Payer: Self-pay | Admitting: Internal Medicine

## 2017-03-13 ENCOUNTER — Encounter: Payer: Self-pay | Admitting: Internal Medicine

## 2017-03-13 VITALS — BP 116/74 | HR 77 | Temp 98.0°F | Resp 12 | Ht 65.0 in | Wt 184.4 lb

## 2017-03-13 DIAGNOSIS — F640 Transsexualism: Secondary | ICD-10-CM

## 2017-03-13 DIAGNOSIS — E785 Hyperlipidemia, unspecified: Secondary | ICD-10-CM

## 2017-03-13 DIAGNOSIS — Z789 Other specified health status: Secondary | ICD-10-CM

## 2017-03-13 DIAGNOSIS — F411 Generalized anxiety disorder: Secondary | ICD-10-CM

## 2017-03-13 MED ORDER — ALPRAZOLAM 0.5 MG PO TABS: 0.5000 mg | ORAL_TABLET | Freq: Three times a day (TID) | ORAL | 3 refills | Status: DC | PRN

## 2017-03-13 NOTE — Progress Notes (Signed)
Pre visit review using our clinic review tool, if applicable. No additional management support is needed unless otherwise documented below in the visit note. 

## 2017-03-13 NOTE — Progress Notes (Signed)
Subjective:    Patient ID: Ethan Harris, male    DOB: 24-Jan-1986, 31 y.o.   MRN: [id number]  DOS:  03/13/2017 Type of visit - description : rov Interval history: No major concerns Symptoms well-controlled with Zoloft and Xanax Insomnia: Using melatonin consistently, good results, very rarely used Desyrel.   Review of Systems Denies chest pain, difficulty breathing. No nausea, vomiting, diarrhea  Past Medical History:  Diagnosis Date  . Allergic rhinitis   . Anxiety and depression   . Eczema   . 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 Hypercholesteremia   . HYPERLIPIDEMIA 11/16/2010  . Insomnia   . Urolithiasis 07/22/2013    Past Surgical History:  Procedure Laterality Date  . NO PAST SURGERIES      Social History   Social History  . Marital status: Single    Spouse name: N/A  . Number of children: 0  . Years of education: N/A   Occupational History  . entertainer ; also works from home, customer rep    Social History Main Topics  . Smoking status: Former Smoker    Quit date: 04/13/2011  . Smokeless tobacco: Never Used  . Alcohol use Yes     Comment: Socially  . Drug use: No  . Sexual activity: Yes   Other Topics Concern  . Not on file   Social History Narrative   Lives w/1 room mates    (mom- Jennifer [place])           Allergies as of 03/13/2017   No Known Allergies     Medication List       Accurate as of 03/13/17 11:59 PM. Always use your most recent med list.          albuterol 108 (90 Base) MCG/ACT inhaler Commonly known as:  VENTOLIN HFA Inhale 2 puffs into the lungs every 6 (six) hours as needed for wheezing or shortness of breath.   ALDACTONE 50 MG tablet Generic drug:  spironolactone Take 1 tablet (50 mg total) by mouth daily.   ALPRAZolam 0.5 MG tablet Commonly known as:  XANAX Take 1 tablet (0.5 mg total) by mouth 3 (three) times daily as needed for  anxiety.   estradiol 2 MG tablet Commonly known as:  ESTRACE Take 2 mg by mouth 3 (three) times daily.   hydrocortisone 2.5 % cream Apply topically 2 (two) times daily.   pantoprazole 40 MG tablet Commonly known as:  PROTONIX Take 1 tablet (40 mg total) by mouth daily.   sertraline 100 MG tablet Commonly known as:  ZOLOFT Take 1 tablet (100 mg total) by mouth daily.   traZODone 50 MG tablet Commonly known as:  DESYREL Take 1 tablet (50 mg total) by mouth at bedtime as needed for sleep.          Objective:   Physical Exam BP 116/74 (BP Location: Left Arm, Patient Position: Sitting, Cuff Size: Normal)   Pulse 77   Temp 98 F (36.7 C) (Oral)   Resp 12   Ht  (1.651 m)   Wt 184 lb 6 oz (83.6 kg)   SpO2 98%   BMI 30.68 kg/m  General:   Well developed, well nourished . NAD.  HEENT:  Normocephalic . Face symmetric, atraumatic Lungs:  CTA B Normal respiratory effort, no intercostal retractions, no accessory muscle use. Heart: RRR,  no murmur.  No pretibial edema bilaterally  Skin: Not  pale. Not jaundice Neurologic:  alert & oriented X3.  Speech normal, gait appropriate for age and unassisted Psych--  Cognition and judgment appear intact.  Cooperative with normal attention span and concentration.  Behavior appropriate. No anxious or depressed appearing.      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 For insomnia is using melatonin every night and occasionally trazodone. Refill Xanax. 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 Ruby Cola, reportedly he got labs recently at that office. Asthma:  Not an issue at this point. RTC, CPX 6 months:

## 2017-03-13 NOTE — Patient Instructions (Signed)
  GO TO THE FRONT DESK Schedule your next appointment for a physical exam in 6 months     

## 2017-03-14 NOTE — Assessment & Plan Note (Signed)
Hyperlipidemia: Diet control, last FLP satisfactory Anxiety depression insomnia: Well controlled with Zoloft, Xanax. For insomnia is using melatonin every night and occasionally trazodone. Refill Xanax. 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 Ruby ColaPittaway, reportedly he got labs recently at that office. Asthma: Not an issue at this point. RTC, CPX 6 months:

## 2017-06-13 ENCOUNTER — Telehealth: Payer: Self-pay | Admitting: Internal Medicine

## 2017-06-13 NOTE — Telephone Encounter (Signed)
Pt is requesting refill on alprazolam. Pt needing new Rx, was using [facility] on [address] (location closed), Pt now using [facility].   Last OV: 02/01/2017 Last Fill: 03/13/2017 #90 and 3RF (Pt sig 1 tab TID PRN) [facility] unable to transfer the full 30 day supply over to new pharmacy, only gave #30 tabs on 06/08/2017 which is a 10 day supply and has no refills no file) UDS: 04/09/2017 Low risk  [place] Controlled Substance database printed; coincides w/ above information, no other issues noted.  Please advise.

## 2017-06-13 NOTE — Telephone Encounter (Signed)
Caller name: Oretha MilchSean Reetz Relationship to patient: self Can be reached: [phone] [facility] [address] [facility] [place], [place] - [address]  Reason for call: pt changed pharmacy. [facility] closing and [facility] they need a new RX for the Xanax. They said new order required to prescribe above 30 pills per month? Pt request it be sent in. Tomorrow is ok.

## 2017-06-14 MED ORDER — ALPRAZOLAM 0.5 MG PO TABS: 0.5000 mg | ORAL_TABLET | Freq: Three times a day (TID) | ORAL | 1 refills | Status: DC | PRN

## 2017-06-14 NOTE — Telephone Encounter (Signed)
Rx printed, awaiting MD signature.  

## 2017-06-14 NOTE — Telephone Encounter (Signed)
Ok 90 and 1 RF 

## 2017-06-14 NOTE — Telephone Encounter (Signed)
Rx faxed to [facility] pharmacy.  

## 2017-07-08 ENCOUNTER — Other Ambulatory Visit: Payer: Self-pay | Admitting: Internal Medicine

## 2017-07-18 ENCOUNTER — Telehealth: Payer: Self-pay | Admitting: Internal Medicine

## 2017-07-18 MED ORDER — ALBUTEROL SULFATE HFA 108 (90 BASE) MCG/ACT IN AERS: 2.0000 | INHALATION_SPRAY | Freq: Four times a day (QID) | RESPIRATORY_TRACT | 5 refills | Status: DC | PRN

## 2017-07-18 NOTE — Telephone Encounter (Signed)
°  Relation to ZO:XWRUpt:self Call back number:[phone] [facility] [address] - [place], [place] - [address] [phone] (Phone) [phone] (Fax)     Reason for call:  Patient requesting a refill albuterol (VENTOLIN HFA) 108 (90 Base) MCG/ACT inhaler

## 2017-07-18 NOTE — Telephone Encounter (Signed)
Rx sent 

## 2017-09-16 ENCOUNTER — Encounter: Payer: Self-pay | Admitting: Internal Medicine

## 2017-09-16 ENCOUNTER — Ambulatory Visit (INDEPENDENT_AMBULATORY_CARE_PROVIDER_SITE_OTHER): Payer: Self-pay | Admitting: Internal Medicine

## 2017-09-16 VITALS — BP 126/80 | HR 78 | Temp 97.8°F | Ht 65.0 in | Wt 194.0 lb

## 2017-09-16 DIAGNOSIS — Z0000 Encounter for general adult medical examination without abnormal findings: Secondary | ICD-10-CM

## 2017-09-16 DIAGNOSIS — G4700 Insomnia, unspecified: Secondary | ICD-10-CM

## 2017-09-16 DIAGNOSIS — F411 Generalized anxiety disorder: Secondary | ICD-10-CM

## 2017-09-16 MED ORDER — BETAMETHASONE DIPROPIONATE AUG 0.05 % EX CREA: TOPICAL_CREAM | Freq: Two times a day (BID) | CUTANEOUS | 0 refills | Status: DC

## 2017-09-16 MED ORDER — KETOCONAZOLE 2 % EX CREA: 1.0000 "application " | TOPICAL_CREAM | Freq: Every day | CUTANEOUS | 0 refills | Status: DC

## 2017-09-16 MED ORDER — AZELASTINE HCL 0.1 % NA SOLN: 2.0000 | Freq: Two times a day (BID) | NASAL | 5 refills | Status: DC | PRN

## 2017-09-16 NOTE — Patient Instructions (Addendum)
GO TO THE LAB : Get the blood work     GO TO THE FRONT DESK Schedule your next appointment for a checkup in 8 months  For the eczema: Use the 2 creams I prescribed twice a day for 10 days  For the cold: Tylenol, Robitussin-DM, OTC Flonase daily. Also Astelin twice a day, is on nasal spray. If not improving in a few days let me know

## 2017-09-16 NOTE — Progress Notes (Signed)
Subjective:    Patient ID: Ethan Harris, adult    DOB: 09-23-1986, 31 y.o.   MRN: [id number]  DOS:  09/16/2017 Type of visit - description : cpx Interval history: Has a couple of concerns: Develop a very itchy rash at the left hand and chest about 10 days ago. Using OTC steroid cream on the hand because she thinks is eczema and a  ringworm medication on the chest because she thinks has a fungal infection.  Both are slightly better with treatment. Had a URI last week, chest symptoms better but he has sinus congestion with nasal discharge.    Review of Systems Denies fever chills No nausea vomiting No myalgias.  Other than above, a 14 point review of systems is negative     Past Medical History:  Diagnosis Date  . Allergic rhinitis   . Anxiety and depression   . Eczema   . 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 Hypercholesteremia   . HYPERLIPIDEMIA 11/16/2010  . Insomnia   . Urolithiasis 07/22/2013    Past Surgical History:  Procedure Laterality Date  . NO PAST SURGERIES      Social History   Socioeconomic History  . Marital status: Significant Other    Spouse name: Not on file  . Number of children: 0  . Years of education: Not on file  . Highest education level: Not on file  Social Needs  . Financial resource strain: Not on file  . Food insecurity - worry: Not on file  . Food insecurity - inability: Not on file  . Transportation needs - medical: Not on file  . Transportation needs - non-medical: Not on file  Occupational History  . Occupation: entertainer    Tobacco Use  . Smoking status: Former Smoker    Last attempt to quit: 04/13/2011    Years since quitting: 6.4  . Smokeless tobacco: Never Used  Substance and Sexual Activity  . Alcohol use: Yes    Comment: Socially  . Drug use: No  . Sexual activity: Yes  Other Topics Concern  . Not on file  Social History Narrative   Lives  w/partner, partner is a male to male transgender   (pt's mother is Ethan Harris)          Family History  Problem Relation Age of Onset  . Colon cancer Neg Hx   . Prostate cancer Neg Hx   . Diabetes Neg Hx   . Heart attack Neg Hx      Allergies as of 09/16/2017   No Known Allergies     Medication List        Accurate as of 09/16/17 11:59 PM. Always use your most recent med list.          albuterol 108 (90 Base) MCG/ACT inhaler Commonly known as:  VENTOLIN HFA Inhale 2 puffs into the lungs every 6 (six) hours as needed for wheezing or shortness of breath.   ALDACTONE 50 MG tablet Generic drug:  spironolactone Take 1 tablet (50 mg total) by mouth daily.   ALPRAZolam 0.5 MG tablet Commonly known as:  XANAX Take 1 tablet (0.5 mg total) by mouth 3 (three) times daily as needed for anxiety.   augmented betamethasone dipropionate 0.05 % cream Commonly known as:  DIPROLENE-AF Apply 2 (two) times daily topically.   azelastine 0.1 % nasal spray Commonly known as:  ASTELIN Place 2 sprays 2 (two)  times daily as needed into both nostrils for rhinitis or allergies. Use in each nostril as directed   estradiol 2 MG tablet Commonly known as:  ESTRACE Take 2 mg 2 (two) times daily by mouth.   ketoconazole 2 % cream Commonly known as:  NIZORAL Apply 1 application daily topically.   pantoprazole 40 MG tablet Commonly known as:  PROTONIX Take 1 tablet (40 mg total) by mouth daily.   sertraline 100 MG tablet Commonly known as:  ZOLOFT Take 1 tablet (100 mg total) by mouth daily.   traZODone 50 MG tablet Commonly known as:  DESYREL Take 1 tablet (50 mg total) by mouth at bedtime as needed for sleep.          Objective:   Physical Exam BP 126/80   Pulse 78   Temp 97.8 F (36.6 C) (Oral)   Ht 5\' 5"  (1.651 m)   Wt 194 lb (88 kg)   SpO2 98%   BMI 32.28 kg/m  General:   Well developed, well nourished . NAD.  Neck: No  thyromegaly  HEENT:  Normocephalic .  Face symmetric, atraumatic. Nose is slightly congested, TMs normal, throat is slightly red without discharge.  Symmetric. Sinuses non-TTP Lungs:  CTA B Normal respiratory effort, no intercostal retractions, no accessory muscle use. Heart: RRR,  no murmur.  No pretibial edema bilaterally  Abdomen:  Not distended, soft, non-tender. No rebound or rigidity.   Skin: See pictures Neurologic:  alert & oriented X3.  Speech normal, gait appropriate for age and unassisted Strength symmetric and appropriate for age.  Psych: Cognition and judgment appear intact.  Cooperative with normal attention span and concentration.  Behavior appropriate. No anxious or depressed appearing.  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  UDS and contract today Eczema versus a fungal infection: Will prescribe ketoconazole and a potent steroid for both , chest and hand rash. URI: Treat conservatively, see instructions.  Asthma not exacerbated Transgender male to male: Follow-up elsewere RTC 3 months

## 2017-09-16 NOTE — Assessment & Plan Note (Addendum)
-  Td 2013. declined to shot. -Labs: CMP, FLP, CBC, TSH -Diet and exercise discussed

## 2017-09-17 ENCOUNTER — Encounter: Payer: Self-pay | Admitting: Internal Medicine

## 2017-09-17 LAB — COMPREHENSIVE METABOLIC PANEL
ALBUMIN: 4 g/dL (ref 3.5–5.2)
ALK PHOS: 51 U/L (ref 39–117)
ALT: 21 U/L (ref 0–53)
AST: 16 U/L (ref 0–37)
BUN: 9 mg/dL (ref 6–23)
CO2: 24 mEq/L (ref 19–32)
Calcium: 9.1 mg/dL (ref 8.4–10.5)
Chloride: 103 mEq/L (ref 96–112)
Creatinine, Ser: 0.43 mg/dL (ref 0.40–1.50)
GFR: 243.02 mL/min (ref >60.00)
GLUCOSE: 93 mg/dL (ref 70–99)
Potassium: 4.4 mEq/L (ref 3.5–5.1)
SODIUM: 137 meq/L (ref 135–145)
TOTAL PROTEIN: 7.1 g/dL (ref 6.0–8.3)
Total Bilirubin: 0.7 mg/dL (ref 0.2–1.2)

## 2017-09-17 LAB — LIPID PANEL
Cholesterol: 217 mg/dL — ABNORMAL HIGH (ref 0–200)
HDL: 39.2 mg/dL (ref >39.00)
NonHDL: 177.49
Total CHOL/HDL Ratio: 6
Triglycerides: 229 mg/dL — ABNORMAL HIGH (ref 0.0–149.0)
VLDL: 45.8 mg/dL — ABNORMAL HIGH (ref 0.0–40.0)

## 2017-09-17 LAB — CBC WITH DIFFERENTIAL/PLATELET
BASOS PCT: 1 % (ref 0.0–3.0)
Basophils Absolute: 0.1 10*3/uL (ref 0.0–0.1)
EOS ABS: 0.2 10*3/uL (ref 0.0–0.7)
EOS PCT: 2 % (ref 0.0–5.0)
HEMATOCRIT: 37.5 % — AB (ref 39.0–52.0)
HEMOGLOBIN: 12.6 g/dL — AB (ref 13.0–17.0)
LYMPHS PCT: 21.9 % (ref 12.0–46.0)
Lymphs Abs: 2.3 10*3/uL (ref 0.7–4.0)
MCHC: 33.5 g/dL (ref 30.0–36.0)
MCV: 86.8 fl (ref 78.0–100.0)
Monocytes Absolute: 1.1 10*3/uL — ABNORMAL HIGH (ref 0.1–1.0)
Monocytes Relative: 9.9 % (ref 3.0–12.0)
Neutro Abs: 6.9 10*3/uL (ref 1.4–7.7)
Neutrophils Relative %: 65.2 % (ref 43.0–77.0)
Platelets: 247 10*3/uL (ref 150.0–400.0)
RBC: 4.32 Mil/uL (ref 4.22–5.81)
RDW: 13.5 % (ref 11.5–15.5)
WBC: 10.6 10*3/uL — AB (ref 4.0–10.5)

## 2017-09-17 LAB — LDL CHOLESTEROL, DIRECT: LDL DIRECT: 141 mg/dL

## 2017-09-17 LAB — TSH: TSH: 1.36 u[IU]/mL (ref 0.35–4.50)

## 2017-09-17 NOTE — Assessment & Plan Note (Signed)
Hyperlipidemia: Diet controlled, checking labs Anxiety, depression, insomnia: On Zoloft daily, Xanax to 3 times a day, melatonin qhs and desyrel prn.  UDS and contract today Eczema versus a fungal infection: Will prescribe ketoconazole and a potent steroid for both , chest and hand rash. URI: Treat conservatively, see instructions.  Asthma not exacerbated Transgender male to male: Follow-up elsewere RTC 3 months

## 2017-09-19 LAB — PAIN MGMT, PROFILE 8 W/CONF, U
6 Acetylmorphine: NEGATIVE ng/mL (ref <10)
ALCOHOL METABOLITES: NEGATIVE ng/mL (ref <500)
ALPHAHYDROXYTRIAZOLAM: NEGATIVE ng/mL (ref <50)
AMINOCLONAZEPAM: NEGATIVE ng/mL (ref <25)
AMPHETAMINES: NEGATIVE ng/mL (ref <500)
Alphahydroxyalprazolam: 160 ng/mL — ABNORMAL HIGH (ref <25)
Alphahydroxymidazolam: NEGATIVE ng/mL (ref <50)
BUPRENORPHINE, URINE: NEGATIVE ng/mL (ref <5)
Benzodiazepines: POSITIVE ng/mL — AB (ref <100)
COCAINE METABOLITE: NEGATIVE ng/mL (ref <150)
CREATININE: 128.7 mg/dL
Hydroxyethylflurazepam: NEGATIVE ng/mL (ref <50)
LORAZEPAM: NEGATIVE ng/mL (ref <50)
MDMA: NEGATIVE ng/mL (ref <500)
Marijuana Metabolite: NEGATIVE ng/mL (ref <20)
Nordiazepam: NEGATIVE ng/mL (ref <50)
OXAZEPAM: NEGATIVE ng/mL (ref <50)
OXIDANT: NEGATIVE ug/mL (ref <200)
OXYCODONE: NEGATIVE ng/mL (ref <100)
Opiates: NEGATIVE ng/mL (ref <100)
PH: 6.52 (ref 4.5–9.0)
TEMAZEPAM: NEGATIVE ng/mL (ref <50)

## 2017-10-05 ENCOUNTER — Other Ambulatory Visit: Payer: Self-pay | Admitting: Internal Medicine

## 2017-10-07 NOTE — Telephone Encounter (Signed)
Rx faxed to [facility] pharmacy.  

## 2017-10-07 NOTE — Telephone Encounter (Signed)
Pt is requesting refill on alprazolam 0.5mg .  Last OV: 09/16/2017  Last Fill: 06/14/2017 #90 and 1RF (Pt sig: 1 tablet tid prn) UDS: 09/16/2017 Low risk  [place], no issues noted  Please advise.

## 2017-10-07 NOTE — Telephone Encounter (Signed)
Rx printed, awaiting MD signature.  

## 2017-10-07 NOTE — Telephone Encounter (Signed)
Okay #90 and 1 refill 

## 2017-12-25 ENCOUNTER — Telehealth: Payer: Self-pay | Admitting: Internal Medicine

## 2017-12-25 NOTE — Telephone Encounter (Signed)
sent 

## 2017-12-25 NOTE — Telephone Encounter (Signed)
Pt is requesting refill on alprazolam   Last OV: 09/16/2017 Last Fill: 10/07/2017 #90 and 1RF (Pt sig: 1 tab tid prn) UDS: 09/16/2017 Low risk  [place] printed- no issues noted   Please advise.

## 2018-04-22 ENCOUNTER — Telehealth: Payer: Self-pay | Admitting: Internal Medicine

## 2018-04-23 NOTE — Telephone Encounter (Signed)
Sent!

## 2018-04-23 NOTE — Telephone Encounter (Signed)
Pt is requesting refill on alprazolam.   Last OV: 09/16/2017 Last Fill: 12/25/2017 #90 and 2RF (Pt sig: 1 tab tid prn) UDS: 09/16/2017 Low risk  [place] in media 12/25/2017- no discrepancies noted  Please advise.

## 2018-05-06 ENCOUNTER — Ambulatory Visit (INDEPENDENT_AMBULATORY_CARE_PROVIDER_SITE_OTHER): Payer: Self-pay | Admitting: Physician Assistant

## 2018-05-06 ENCOUNTER — Encounter: Payer: Self-pay | Admitting: Physician Assistant

## 2018-05-06 VITALS — BP 122/82 | HR 78 | Wt 189.0 lb

## 2018-05-06 DIAGNOSIS — E349 Endocrine disorder, unspecified: Secondary | ICD-10-CM

## 2018-05-06 DIAGNOSIS — F640 Transsexualism: Secondary | ICD-10-CM

## 2018-05-06 DIAGNOSIS — Z5181 Encounter for therapeutic drug level monitoring: Secondary | ICD-10-CM | POA: Insufficient documentation

## 2018-05-06 DIAGNOSIS — IMO0001 Reserved for inherently not codable concepts without codable children: Secondary | ICD-10-CM | POA: Insufficient documentation

## 2018-05-06 DIAGNOSIS — Z79899 Other long term (current) drug therapy: Secondary | ICD-10-CM | POA: Insufficient documentation

## 2018-05-06 DIAGNOSIS — Z7689 Persons encountering health services in other specified circumstances: Secondary | ICD-10-CM

## 2018-05-06 DIAGNOSIS — Z8639 Personal history of other endocrine, nutritional and metabolic disease: Secondary | ICD-10-CM | POA: Insufficient documentation

## 2018-05-06 MED ORDER — SPIRONOLACTONE 50 MG PO TABS: 50.0000 mg | ORAL_TABLET | Freq: Every day | ORAL | 1 refills | Status: DC

## 2018-05-06 MED ORDER — ESTRADIOL 2 MG PO TABS: 2.0000 mg | ORAL_TABLET | Freq: Two times a day (BID) | ORAL | 1 refills | Status: DC

## 2018-05-06 NOTE — Progress Notes (Signed)
HPI:                                                                Ethan Harris is a 32 y.o. adult who presents to [name]: Primary Care Sports Medicine today to establish care   Patient presents to establish care for HRT only. Continues to follow with Dr. Drue Novel for primary care.   Ethan Harris is a pleasant 32 yo transgender male, assigned male at birth. Has been followed by Dr. Ruby Cola since January 2017 for cross-sex hormone therapy. Taking Estradiol 2 mg twice a day and Spironolactone 50 mg daily. Ran out of estradiol about 2 weeks ago. Reports history of hyperkalemia related to spironolactone, which resolved with reducing dose of Spironolactone. No current concerns related to hormones.  Depression screen [facility] 2/9 05/06/2018 07/22/2013  Decreased Interest 0 0  Down, Depressed, Hopeless 1 0  PHQ - 2 Score 1 0  Altered sleeping 2 -  Tired, decreased energy 1 -  Change in appetite 2 -  Feeling bad or failure about yourself  0 -  Trouble concentrating 0 -  Moving slowly or fidgety/restless 0 -  Suicidal thoughts 0 -  PHQ-9 Score 6 -  Difficult doing work/chores Not difficult at all -    GAD 7 : Generalized Anxiety Score 05/06/2018  Nervous, Anxious, on Edge 1  Control/stop worrying 0  Worry too much - different things 0  Trouble relaxing 0  Restless 0  Easily annoyed or irritable 1  Afraid - awful might happen 0  Total GAD 7 Score 2  Anxiety Difficulty Not difficult at all      Past Medical History:  Diagnosis Date  . Allergic rhinitis   . Anxiety and depression   . Eczema   . 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 Hypercholesteremia   . HYPERLIPIDEMIA 11/16/2010  . Insomnia   . Urolithiasis 07/22/2013   Past Surgical History:  Procedure Laterality Date  . INJECTION OF SILICONE OIL  2009   lips, cheeks, buttock and thighs  . NO PAST SURGERIES     Social History   Tobacco  Use  . Smoking status: Former Smoker    Packs/day: 0.50    Years: 8.00    Pack years: 4.00    Types: Cigarettes    Last attempt to quit: 04/13/2011    Years since quitting: 7.0  . Smokeless tobacco: Never Used  Substance Use Topics  . Alcohol use: Yes    Alcohol/week: 1.2 - 1.8 oz    Types: 2 - 3 Standard drinks or equivalent per week    Comment: Socially   family history includes Depression in her mother; Drug abuse in her maternal grandmother and paternal grandmother; Hypertension in her maternal grandmother; Skin cancer in her maternal grandmother; Stroke in her maternal grandmother.    ROS: Review of Systems  Respiratory: Positive for wheezing (asthma).   Musculoskeletal: Positive for back pain.  Skin: Positive for rash (eczema).  Endo/Heme/Allergies:       + polyphagia + heat/cold intolerance  Psychiatric/Behavioral: Positive for depression. Negative for substance abuse and suicidal ideas. The patient is nervous/anxious and has insomnia.   All other systems reviewed and  are negative.    Medications: Current Outpatient Medications  Medication Sig Dispense Refill  . albuterol (VENTOLIN HFA) 108 (90 Base) MCG/ACT inhaler Inhale 2 puffs into the lungs every 6 (six) hours as needed for wheezing or shortness of breath. 18 g 5  . ALPRAZolam (XANAX) 0.5 MG tablet TAKE ONE TABLET BY MOUTH THREE TIMES A DAY AS NEEDED 90 tablet 1  . betamethasone dipropionate (DIPROLENE) 0.05 % cream Apply 1 application topically 2 (two) times daily as needed.    Marland Kitchen. estradiol (ESTRACE) 2 MG tablet Take 1 tablet (2 mg total) by mouth 2 (two) times daily. 60 tablet 1  . ketoconazole (NIZORAL) 2 % cream Apply 1 application topically daily as needed.    . Melatonin 10 MG CAPS Take by mouth.    . sertraline (ZOLOFT) 100 MG tablet Take 1 tablet (100 mg total) by mouth daily. 30 tablet 3  . spironolactone (ALDACTONE) 50 MG tablet Take 1 tablet (50 mg total) by mouth daily. 30 tablet 1  . traZODone (DESYREL)  50 MG tablet Take 1 tablet (50 mg total) by mouth at bedtime as needed for sleep. 30 tablet 1   No current facility-administered medications for this visit.    No Known Allergies     Objective:  BP 122/82   Pulse 78   Wt 189 lb (85.7 kg)   BMI 31.45 kg/m  Gen:  alert, not ill-appearing, no distress, appropriate for age HEENT: head normocephalic without obvious abnormality, conjunctiva and cornea clear, wearing glasses, trachea midline Pulm: Normal work of breathing, normal phonation, clear to auscultation bilaterally, no wheezes, rales or rhonchi CV: Normal rate, regular rhythm, s1 and s2 distinct, no murmurs, clicks or rubs  Neuro: alert and oriented x 3, no tremor MSK: extremities atraumatic, normal gait and station, no peripheral edema Skin: intact, no rashes on exposed skin, no jaundice, no cyanosis Psych: well-groomed, cooperative, good eye contact, euthymic mood, affect mood-congruent, speech is articulate, and thought processes clear and goal-directed    No results found for this or any previous visit (from the past 72 hour(s)). No results found.    Assessment and Plan: 32 y.o. 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  Cont low-dose Spironolactone 50 mg   Patient education and anticipatory guidance given Patient agrees with treatment plan Follow-up in 1 month or sooner as needed if symptoms worsen or fail to improve  Levonne Hubertharley E. Johntay Doolen PA-C

## 2018-05-06 NOTE — Patient Instructions (Signed)
Hyperkalemia  Hyperkalemia is when you have too much potassium in your blood. Potassium is normally removed (excreted) from your body by your kidneys. If there is too much potassium in your blood, it can affect your heart's ability to function.  What are the causes?  Hyperkalemia may be caused by:   Taking in too much potassium. You can do this by:  ? Using salt substitutes. They contain large amounts of potassium.  ? Taking potassium supplements.  ? Eating foods high in potassium.   Excreting too little potassium. This can happen if:  ? Your kidneys are not working properly. Kidney (renal) disease, including short- or long-term renal failure, is a very common cause of hyperkalemia.  ? You are taking medicines that lower your excretion of potassium.  ? You have Addison disease.  ? You have a urinary tract blockage, such as kidney stones.  ? You are on treatment to mechanically clean your blood (dialysis) and you skip a treatment.   Releasing a high amount of potassium from your cells into your blood. This can happen with:  ? Injury to muscles (rhabdomyolysis) or other tissues. Most potassium is stored in your muscles.  ? Severe burns or infections.  ? Acidic blood plasma (acidosis). Acidosis can result from many diseases, such as uncontrolled diabetes.    What increases the risk?  The most common risk factor of hyperkalemia is kidney disease. Other risk factors of hyperkalemia include:   Addison disease. This is a condition where your glands do not produce enough hormones.   Alcoholism or heavy drug use.   Using certain blood pressure medicines, such as angiotensin-converting enzyme (ACE) inhibitors, angiotensin II receptor blockers (ARBs), or potassium-sparing diuretics such as spironolactone.   Severe injury or burn.    What are the signs or symptoms?  Oftentimes, there are no signs or symptoms of hyperkalemia. However, when your potassium level becomes high enough, you may experience symptoms such  as:   Irregular or very slow heartbeat.   Nausea.   Fatigue.   Tingling of the skin or numbness of the hands or feet.   Muscle weakness.   Fatigue.   Not being able to move (paralysis).    You may not have any symptoms of hyperkalemia.  How is this diagnosed?  Hyperkalemia may be diagnosed by:   Physical exam.   Blood tests.   ECG (electrocardiogram).   Discussion of prescription and non-prescription drug use.    How is this treated?  Treatment for hyperkalemia is often directed at the underlying cause. In some instances, treatment may include:   Insulin.   Glucose (sugar) and water solution given through a vein (intravenous or IV).   Dialysis.   Medicines to remove the potassium from your body.   Medicines to move calcium from your bloodstream into your tissues.    Follow these instructions at home:   Take medicines only as directed by your health care provider.   Do not take any supplements, natural products, herbs, or vitamins without reviewing them with your health care provider. Certain supplements and natural food products can have high amounts of potassium.   Limit your alcohol intake as directed by your health care provider.   Stop illegal drug use. If you need help quitting, ask your health care provider.   Keep all follow-up visits as directed by your health care provider. This is important.   If you have kidney disease, you may need to follow a low potassium diet. A dietitian   can help educate you on low potassium foods.  Contact a health care provider if:   You notice an irregular or very slow heartbeat.   You feel light-headed.   You feel weak.   You are nauseous.   You have tingling or numbness in your hands or feet.  Get help right away if:   You have shortness of breath.   You have chest pain or discomfort.   You pass out.   You have muscle paralysis.  This information is not intended to replace advice given to you by your health care provider. Make sure you discuss any  questions you have with your health care provider.  Document Released: 10/19/2002 Document Revised: 04/05/2016 Document Reviewed: 02/03/2014  Elsevier Interactive Patient Education  2018 [employer]

## 2018-05-07 LAB — BASIC METABOLIC PANEL
BUN: 12 mg/dL (ref 7–25)
CO2: 26 mmol/L (ref 20–32)
CREATININE: 0.66 mg/dL (ref 0.60–1.35)
Calcium: 9.8 mg/dL (ref 8.6–10.3)
Chloride: 102 mmol/L (ref 98–110)
GLUCOSE: 87 mg/dL (ref 65–99)
Potassium: 4.3 mmol/L (ref 3.5–5.3)
SODIUM: 137 mmol/L (ref 135–146)

## 2018-05-07 NOTE — Progress Notes (Signed)
Good morning Tia,  Your electrolytes, specifically potassium, look great. Continue Spironolactone at current dose.   Best,  Vinetta Bergamoharley

## 2018-05-14 ENCOUNTER — Encounter: Payer: Self-pay | Admitting: Physician Assistant

## 2018-05-16 ENCOUNTER — Ambulatory Visit: Payer: Self-pay | Admitting: Internal Medicine

## 2018-06-03 ENCOUNTER — Ambulatory Visit: Payer: Self-pay | Admitting: Physician Assistant

## 2018-06-18 ENCOUNTER — Ambulatory Visit: Payer: Self-pay | Admitting: Physician Assistant

## 2018-06-19 ENCOUNTER — Other Ambulatory Visit: Payer: Self-pay

## 2018-06-19 DIAGNOSIS — Z5181 Encounter for therapeutic drug level monitoring: Secondary | ICD-10-CM

## 2018-06-19 DIAGNOSIS — E349 Endocrine disorder, unspecified: Secondary | ICD-10-CM

## 2018-06-19 DIAGNOSIS — F640 Transsexualism: Secondary | ICD-10-CM

## 2018-06-19 DIAGNOSIS — IMO0001 Reserved for inherently not codable concepts without codable children: Secondary | ICD-10-CM

## 2018-06-22 LAB — ESTRADIOL: Estradiol: 40 pg/mL — ABNORMAL HIGH (ref <=39)

## 2018-06-22 LAB — TESTOSTERONE, TOTAL, LC/MS/MS: Testosterone, Total, LC-MS-MS: 289 ng/dL (ref 250–1100)

## 2018-06-24 MED ORDER — SPIRONOLACTONE 100 MG PO TABS: 100.0000 mg | ORAL_TABLET | Freq: Two times a day (BID) | ORAL | 2 refills | Status: DC

## 2018-06-24 MED ORDER — ESTRADIOL 2 MG PO TABS: 2.0000 mg | ORAL_TABLET | Freq: Three times a day (TID) | ORAL | 2 refills | Status: DC

## 2018-06-24 NOTE — Progress Notes (Signed)
Hi Tia,  Your testosterone level is not at goal. We would like this below 55.  I would recommend we increase your Spironolactone to 100 mg twice a day and increase your Estrogen to 2 mg three times a day. If you cant do three times day, you can do 3 mg twice a day.   I want to see you in the office in 3 months for follow-up and to recheck your levels.  Hope you're doing well! Vinetta Bergamoharley

## 2018-06-26 ENCOUNTER — Ambulatory Visit: Payer: Self-pay | Admitting: Physician Assistant

## 2018-07-02 ENCOUNTER — Encounter: Payer: Self-pay | Admitting: Physician Assistant

## 2018-07-02 ENCOUNTER — Ambulatory Visit (INDEPENDENT_AMBULATORY_CARE_PROVIDER_SITE_OTHER): Payer: Self-pay | Admitting: Physician Assistant

## 2018-07-02 VITALS — BP 132/80 | HR 80 | Wt 186.0 lb

## 2018-07-02 DIAGNOSIS — IMO0001 Reserved for inherently not codable concepts without codable children: Secondary | ICD-10-CM

## 2018-07-02 DIAGNOSIS — Z79899 Other long term (current) drug therapy: Secondary | ICD-10-CM

## 2018-07-02 DIAGNOSIS — R030 Elevated blood-pressure reading, without diagnosis of hypertension: Secondary | ICD-10-CM

## 2018-07-02 DIAGNOSIS — F640 Transsexualism: Secondary | ICD-10-CM

## 2018-07-02 DIAGNOSIS — E349 Endocrine disorder, unspecified: Secondary | ICD-10-CM

## 2018-07-02 DIAGNOSIS — F411 Generalized anxiety disorder: Secondary | ICD-10-CM

## 2018-07-02 MED ORDER — SPIRONOLACTONE 100 MG PO TABS: 100.0000 mg | ORAL_TABLET | Freq: Every day | ORAL | 2 refills | Status: DC

## 2018-07-02 MED ORDER — MEDROXYPROGESTERONE ACETATE 2.5 MG PO TABS: 2.5000 mg | ORAL_TABLET | Freq: Every day | ORAL | 3 refills | Status: DC

## 2018-07-02 MED ORDER — ALPRAZOLAM 0.5 MG PO TABS: 0.5000 mg | ORAL_TABLET | Freq: Two times a day (BID) | ORAL | 0 refills | Status: DC | PRN

## 2018-07-02 MED ORDER — SERTRALINE HCL 100 MG PO TABS: 100.0000 mg | ORAL_TABLET | Freq: Every day | ORAL | 1 refills | Status: DC

## 2018-07-02 MED ORDER — ESTRADIOL 2 MG PO TABS: 4.0000 mg | ORAL_TABLET | Freq: Every day | ORAL | 2 refills | Status: DC

## 2018-07-02 NOTE — Progress Notes (Signed)
HPI:                                                                Ethan Harris is a 32 y.o. adult [facility] [place] [facility] today for medication mgmt  Very pleasant 32 yo transfeminine woman, AMAB. Recent labs showed elevated testosterone. Recommended increasing Spironolactone and Estradiol. Reports history of hyperkalemia with Spironolactone dose of 200mg , so she has kept her dose the same at 100 mg. She tried 6 mg of Estradiol and reports worsening mood and anxiety, so reduced back to 4 mg. She is interested in starting Provera for breast development.   GAD: currently takes Sertraline 100 mg, Alprazolam 0.5 mg twice day, occasionaly takes an extra 0.25 mg for breakthrough anxiety. Rarely takes Trazodone 50 mg prn for insomnia. Takes Melatonin 1.5 mg at bedtime for sleep. Denies depressed mood. Reports anxiety is well-controlled on this regimen. She has tried 200 mg of Sertraline and reports worsening anxiety.  [facility] 2/9 05/06/2018 07/22/2013  Decreased Interest 0 0  Down, Depressed, Hopeless 1 0  PHQ - 2 Score 1 0  Altered sleeping 2 -  Tired, decreased energy 1 -  Change in appetite 2 -  Feeling bad or failure about yourself  0 -  Trouble concentrating 0 -  Moving slowly or fidgety/restless 0 -  Suicidal thoughts 0 -  PHQ-9 Score 6 -  Difficult doing work/chores Not difficult at all -    GAD 7 : Generalized Anxiety Score 05/06/2018  Nervous, Anxious, on Edge 1  Control/stop worrying 0  Worry too much - different things 0  Trouble relaxing 0  Restless 0  Easily annoyed or irritable 1  Afraid - awful might happen 0  Total GAD 7 Score 2  Anxiety Difficulty Not difficult at all      Past Medical History:  Diagnosis Date  . Allergic rhinitis   . Anxiety and depression   . Eczema   . 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  Hypercholesteremia   . HYPERLIPIDEMIA 11/16/2010  . Insomnia   . Urolithiasis 07/22/2013   Past Surgical History:  Procedure Laterality Date  . INJECTION OF SILICONE OIL  2009   lips, cheeks, buttock and thighs  . NO PAST SURGERIES     Social History   Tobacco Use  . Smoking status: Former Smoker    Packs/day: 0.50    Years: 8.00    Pack years: 4.00    Types: Cigarettes    Last attempt to quit: 04/13/2011    Years since quitting: 7.2  . Smokeless tobacco: Never Used  Substance Use Topics  . Alcohol use: Yes    Alcohol/week: 2.0 - 3.0 standard drinks    Types: 2 - 3 Standard drinks or equivalent per week    Comment: Socially   family history includes Depression in her mother; Drug abuse in her maternal grandmother and paternal grandmother; Hypertension in her maternal grandmother; Skin cancer in her maternal grandmother; Stroke in her maternal grandmother.    ROS: negative except as noted in the HPI  Medications: Current Outpatient Medications  Medication Sig Dispense Refill  . albuterol (VENTOLIN HFA)  108 (90 Base) MCG/ACT inhaler Inhale 2 puffs into the lungs every 6 (six) hours as needed for wheezing or shortness of breath. 18 g 5  . ALPRAZolam (XANAX) 0.5 MG tablet Take 1 tablet (0.5 mg total) by mouth 2 (two) times daily as needed. 70 tablet 0  . betamethasone dipropionate (DIPROLENE) 0.05 % cream Apply 1 application topically 2 (two) times daily as needed.    Marland Kitchen. estradiol (ESTRACE) 2 MG tablet Take 2 tablets (4 mg total) by mouth daily. 90 tablet 2  . ketoconazole (NIZORAL) 2 % cream Apply 1 application topically daily as needed.    Marland Kitchen. MELATONIN PO Take 1-1.5 mg by mouth.    . sertraline (ZOLOFT) 100 MG tablet Take 1 tablet (100 mg total) by mouth daily. 90 tablet 1  . spironolactone (ALDACTONE) 100 MG tablet Take 1 tablet (100 mg total) by mouth daily. 60 tablet 2  . traZODone (DESYREL) 50 MG tablet Take 1 tablet (50 mg total) by mouth at bedtime as needed for sleep. 30  tablet 1  . medroxyPROGESTERone (PROVERA) 2.5 MG tablet Take 1 tablet (2.5 mg total) by mouth daily. 30 tablet 3   No current facility-administered medications for this visit.    No Known Allergies     Objective:  BP 132/80   Pulse 80   Wt 186 lb (84.4 kg)   BMI 30.95 kg/m  Gen:  alert, not ill-appearing, no distress, appropriate for age, obese adult HEENT: head normocephalic without obvious abnormality, conjunctiva and cornea clear, wearing glasses, trachea midline Pulm: Normal work of breathing, normal phonation  Neuro: alert and oriented x 3, no tremor MSK: extremities atraumatic, normal gait and station Skin: intact, no rashes on exposed skin, no jaundice, no cyanosis Psych: well-groomed, cooperative, good eye contact, euthymic mood, affect mood-congruent, speech is articulate, and thought processes clear and goal-directed  Lab Results  Component Value Date   WBC 10.6 (H) 09/16/2017   HGB 12.6 (L) 09/16/2017   HCT 37.5 (L) 09/16/2017   MCV 86.8 09/16/2017   PLT 247.0 09/16/2017   Lab Results  Component Value Date   CREATININE 0.66 05/06/2018   BUN 12 05/06/2018   NA 137 05/06/2018   K 4.3 05/06/2018   CL 102 05/06/2018   CO2 26 05/06/2018   No results found for: TESTOSTERONE   No results found for this or any previous visit (from the past 72 hour(s)). No results found.    Assessment and Plan: 32 y.o. adult with   .Ethan Harris was seen today for follow-up.  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 -     estradiol (ESTRACE) 2 MG tablet; Take 2 tablets (4 mg total) by mouth daily. -     medroxyPROGESTERone (PROVERA) 2.5 MG tablet; Take 1 tablet (2.5 mg total) by mouth daily.  Generalized anxiety disorder -     sertraline (ZOLOFT) 100 MG tablet; Take 1 tablet (100 mg total) by  mouth daily. -     ALPRAZolam (XANAX) 0.5 MG tablet; Take 1 tablet (0.5 mg total) by mouth 2 (two) times daily as needed.  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 Continue 4 mg daily. Did not want to increase Spironolactone - discussed alternative options would be  adding Bicalutamide (androgen blocker) or switching to Estradiol Valerate. Patient would like to keep regimen the same for now  GAD - refills provided - encouraged to try reducing Alprazolam to 1-2 times prn rather than scheduled   Patient education and anticipatory guidance given Patient agrees with treatment plan Follow-up in 3 months or sooner as needed if symptoms worsen or fail to improve  [name] PA-C

## 2018-07-02 NOTE — Patient Instructions (Addendum)
Other options for suppressing testosterone 1. Bicalutamide (androgen blocking pill, must monitor liver) 2. Estradiol Valerate (subcutaneous injection)

## 2018-07-09 ENCOUNTER — Encounter: Payer: Self-pay | Admitting: Physician Assistant

## 2018-07-09 DIAGNOSIS — Z79899 Other long term (current) drug therapy: Secondary | ICD-10-CM | POA: Insufficient documentation

## 2018-07-09 DIAGNOSIS — F411 Generalized anxiety disorder: Secondary | ICD-10-CM | POA: Insufficient documentation

## 2018-07-09 DIAGNOSIS — R030 Elevated blood-pressure reading, without diagnosis of hypertension: Secondary | ICD-10-CM | POA: Insufficient documentation

## 2018-08-18 ENCOUNTER — Other Ambulatory Visit: Payer: Self-pay | Admitting: Physician Assistant

## 2018-08-18 DIAGNOSIS — F411 Generalized anxiety disorder: Secondary | ICD-10-CM

## 2018-08-21 ENCOUNTER — Encounter: Payer: Self-pay | Admitting: Physician Assistant

## 2018-08-22 MED ORDER — PANTOPRAZOLE SODIUM 40 MG PO TBEC: 40.0000 mg | DELAYED_RELEASE_TABLET | Freq: Every day | ORAL | 3 refills | Status: DC

## 2018-09-22 ENCOUNTER — Other Ambulatory Visit: Payer: Self-pay | Admitting: Family Medicine

## 2018-09-22 DIAGNOSIS — F411 Generalized anxiety disorder: Secondary | ICD-10-CM

## 2018-09-24 ENCOUNTER — Other Ambulatory Visit: Payer: Self-pay

## 2018-09-24 DIAGNOSIS — F411 Generalized anxiety disorder: Secondary | ICD-10-CM

## 2018-09-25 MED ORDER — ALPRAZOLAM 0.5 MG PO TABS: ORAL_TABLET | ORAL | 0 refills | Status: DC

## 2018-10-02 ENCOUNTER — Ambulatory Visit: Payer: Self-pay | Admitting: Physician Assistant

## 2018-10-06 ENCOUNTER — Ambulatory Visit: Payer: Self-pay | Admitting: Physician Assistant

## 2018-11-01 ENCOUNTER — Other Ambulatory Visit: Payer: Self-pay | Admitting: Physician Assistant

## 2018-11-01 DIAGNOSIS — F411 Generalized anxiety disorder: Secondary | ICD-10-CM

## 2018-11-03 NOTE — Telephone Encounter (Signed)
Requesting RF on Xanax  Last RX sent 09/25/18 for #70 with no RF  RX pended, please review and send if appropriate  PCP out of office, sending to covering provider

## 2018-11-14 ENCOUNTER — Encounter: Payer: Self-pay | Admitting: Physician Assistant

## 2018-11-14 ENCOUNTER — Ambulatory Visit (INDEPENDENT_AMBULATORY_CARE_PROVIDER_SITE_OTHER): Payer: Self-pay | Admitting: Physician Assistant

## 2018-11-14 DIAGNOSIS — F640 Transsexualism: Secondary | ICD-10-CM

## 2018-11-14 DIAGNOSIS — E349 Endocrine disorder, unspecified: Secondary | ICD-10-CM

## 2018-11-14 DIAGNOSIS — IMO0001 Reserved for inherently not codable concepts without codable children: Secondary | ICD-10-CM

## 2018-11-14 MED ORDER — MEDROXYPROGESTERONE ACETATE 2.5 MG PO TABS: 2.5000 mg | ORAL_TABLET | Freq: Every day | ORAL | 5 refills | Status: DC

## 2018-11-14 NOTE — Progress Notes (Signed)
HPI:                                                                Ethan Harris is a 33 y.o. adult who presents to [name]: [facility] today for routine follow-up  She was started on Provera about 5 months ago. Reports she is satisfied with breast development. No concerns regarding hormones.  GAD: reports good mood, anxiety symptoms are controlled with Sertraline 100 mg, Alprazolam 0.5 mg twice day, occasionaly takes an extra 0.25 mg for breakthrough anxiety. Rarely takes Trazodone 50 mg prn for insomnia. Takes Melatonin 1.5 mg at bedtime for sleep. Denies depressed mood. Reports anxiety is well-controlled on this regimen.   Depression screen [name] 2/9 11/14/2018 05/06/2018 07/22/2013  Decreased Interest 0 0 0  Down, Depressed, Hopeless 1 1 0  PHQ - 2 Score 1 1 0  Altered sleeping 1 2 -  Tired, decreased energy 0 1 -  Change in appetite 1 2 -  Feeling bad or failure about yourself  0 0 -  Trouble concentrating 0 0 -  Moving slowly or fidgety/restless 0 0 -  Suicidal thoughts 0 0 -  PHQ-9 Score 3 6 -  Difficult doing work/chores Somewhat difficult Not difficult at all -    GAD 7 : Generalized Anxiety Score 11/14/2018 05/06/2018  Nervous, Anxious, on Edge 1 1  Control/stop worrying 0 0  Worry too much - different things 0 0  Trouble relaxing 1 0  Restless 0 0  Easily annoyed or irritable 0 1  Afraid - awful might happen 0 0  Total GAD 7 Score 2 2  Anxiety Difficulty Somewhat difficult Not difficult at all      Past Medical History:  Diagnosis Date  . Allergic rhinitis   . Anxiety and depression   . Eczema   . 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 Hypercholesteremia   . HYPERLIPIDEMIA 11/16/2010  . Insomnia   . Urolithiasis 07/22/2013   Past Surgical History:  Procedure Laterality Date  . INJECTION OF SILICONE OIL  2009   lips, cheeks, buttock and thighs  . NO  PAST SURGERIES     Social History   Tobacco Use  . Smoking status: Former Smoker    Packs/day: 0.50    Years: 8.00    Pack years: 4.00    Types: Cigarettes    Last attempt to quit: 04/13/2011    Years since quitting: 7.5  . Smokeless tobacco: Never Used  Substance Use Topics  . Alcohol use: Yes    Alcohol/week: 2.0 - 3.0 standard drinks    Types: 2 - 3 Standard drinks or equivalent per week    Comment: Socially   family history includes Depression in her mother; Drug abuse in her maternal grandmother and paternal grandmother; Hypertension in her maternal grandmother; Skin cancer in her maternal grandmother; Stroke in her maternal grandmother.    ROS: negative except as noted in the HPI  Medications: Current Outpatient Medications  Medication Sig Dispense Refill  . albuterol (VENTOLIN HFA) 108 (90 Base) MCG/ACT inhaler Inhale 2 puffs into the lungs every 6 (six) hours as needed for wheezing or shortness of breath. 18  g 5  . ALPRAZolam (XANAX) 0.5 MG tablet TAKE 1 TABLET BY MOUTH TWO TIMES A DAY AS NEEDED 70 tablet 0  . betamethasone dipropionate (DIPROLENE) 0.05 % cream Apply 1 application topically 2 (two) times daily as needed.    Marland Kitchen estradiol (ESTRACE) 2 MG tablet Take 2 tablets (4 mg total) by mouth daily. 90 tablet 2  . ketoconazole (NIZORAL) 2 % cream Apply 1 application topically daily as needed.    . medroxyPROGESTERone (PROVERA) 2.5 MG tablet Take 1 tablet (2.5 mg total) by mouth daily. 30 tablet 5  . MELATONIN PO Take 1-1.5 mg by mouth.    . pantoprazole (PROTONIX) 40 MG tablet Take 1 tablet (40 mg total) by mouth daily. 30 tablet 3  . sertraline (ZOLOFT) 100 MG tablet Take 1 tablet (100 mg total) by mouth daily. 90 tablet 1  . spironolactone (ALDACTONE) 100 MG tablet Take 1 tablet (100 mg total) by mouth daily. 60 tablet 2  . traZODone (DESYREL) 50 MG tablet Take 1 tablet (50 mg total) by mouth at bedtime as needed for sleep. 30 tablet 1   No current  facility-administered medications for this visit.    No Known Allergies     Objective:  BP 118/67 (BP Location: Right Arm, Patient Position: Sitting, Cuff Size: Normal)   Pulse 75   Temp 99 F (37.2 C) (Oral)   Wt 183 lb (83 kg)   BMI 30.45 kg/m  Gen:  alert, not ill-appearing, no distress, appropriate for age, obese adult HEENT: head normocephalic without obvious abnormality, conjunctiva and cornea clear, wearing glasses, trachea midline Pulm: Normal work of breathing, normal phonation Neuro: alert and oriented x 3, no tremor MSK: extremities atraumatic, normal gait and station Skin: intact, no rashes on exposed skin, no jaundice, no cyanosis Psych: well-groomed, cooperative, good eye contact, euthymic mood, affect mood-congruent, speech is articulate, and thought processes clear and goal-directed  Lab Results  Component Value Date   CREATININE 0.66 05/06/2018   BUN 12 05/06/2018   NA 137 05/06/2018   K 4.3 05/06/2018   CL 102 05/06/2018   CO2 26 05/06/2018   Lab Results  Component Value Date   ALT 21 09/16/2017   AST 16 09/16/2017   ALKPHOS 51 09/16/2017   BILITOT 0.7 09/16/2017   Lab Results  Component Value Date   CHOL 217 (H) 09/16/2017   HDL 39.20 09/16/2017   LDLCALC 109 (H) 08/21/2016   LDLDIRECT 141.0 09/16/2017   TRIG 229.0 (H) 09/16/2017   CHOLHDL 6 09/16/2017     No results found for this or any previous visit (from the past 72 hour(s)). No results found.    Assessment and Plan: 33 y.o. 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 -     Estradiol -     Comprehensive metabolic panel -     Testosterone  Labs UTD Refills provided as above    Patient education and anticipatory guidance given Patient agrees with treatment plan Follow-up in 6 months for med mgmt or sooner as  needed if symptoms worsen or fail to improve  [name] PA-C

## 2018-11-19 ENCOUNTER — Encounter: Payer: Self-pay | Admitting: Physician Assistant

## 2018-11-27 ENCOUNTER — Other Ambulatory Visit: Payer: Self-pay | Admitting: Internal Medicine

## 2018-12-09 ENCOUNTER — Other Ambulatory Visit: Payer: Self-pay | Admitting: Physician Assistant

## 2018-12-09 DIAGNOSIS — F411 Generalized anxiety disorder: Secondary | ICD-10-CM

## 2018-12-09 NOTE — Telephone Encounter (Signed)
Last RF 11/03/18 #70 no RF  Last OV 11/14/18  RX pended

## 2019-01-15 ENCOUNTER — Other Ambulatory Visit: Payer: Self-pay | Admitting: Physician Assistant

## 2019-01-15 DIAGNOSIS — E349 Endocrine disorder, unspecified: Secondary | ICD-10-CM

## 2019-01-15 DIAGNOSIS — IMO0001 Reserved for inherently not codable concepts without codable children: Secondary | ICD-10-CM

## 2019-01-15 DIAGNOSIS — F640 Transsexualism: Principal | ICD-10-CM

## 2019-02-12 ENCOUNTER — Encounter: Payer: Self-pay | Admitting: Physician Assistant

## 2019-02-12 ENCOUNTER — Other Ambulatory Visit: Payer: Self-pay | Admitting: Physician Assistant

## 2019-02-12 DIAGNOSIS — F411 Generalized anxiety disorder: Secondary | ICD-10-CM

## 2019-03-15 ENCOUNTER — Other Ambulatory Visit: Payer: Self-pay | Admitting: Family Medicine

## 2019-03-15 DIAGNOSIS — F411 Generalized anxiety disorder: Secondary | ICD-10-CM

## 2019-03-29 ENCOUNTER — Other Ambulatory Visit: Payer: Self-pay | Admitting: Physician Assistant

## 2019-03-29 DIAGNOSIS — IMO0001 Reserved for inherently not codable concepts without codable children: Secondary | ICD-10-CM

## 2019-03-29 DIAGNOSIS — E349 Endocrine disorder, unspecified: Secondary | ICD-10-CM

## 2019-03-30 ENCOUNTER — Encounter: Payer: Self-pay | Admitting: Physician Assistant

## 2019-04-06 ENCOUNTER — Encounter: Payer: Self-pay | Admitting: Physician Assistant

## 2019-04-07 MED ORDER — MONTELUKAST SODIUM 10 MG PO TABS: 10.0000 mg | ORAL_TABLET | Freq: Every day | ORAL | 0 refills | Status: DC

## 2019-04-13 ENCOUNTER — Other Ambulatory Visit: Payer: Self-pay | Admitting: Physician Assistant

## 2019-04-13 DIAGNOSIS — F411 Generalized anxiety disorder: Secondary | ICD-10-CM

## 2019-05-11 ENCOUNTER — Other Ambulatory Visit: Payer: Self-pay | Admitting: Physician Assistant

## 2019-05-11 DIAGNOSIS — F411 Generalized anxiety disorder: Secondary | ICD-10-CM

## 2019-05-15 ENCOUNTER — Other Ambulatory Visit: Payer: Self-pay | Admitting: Sports Medicine

## 2019-05-15 ENCOUNTER — Other Ambulatory Visit: Payer: Self-pay | Admitting: Physician Assistant

## 2019-05-15 DIAGNOSIS — R1013 Epigastric pain: Secondary | ICD-10-CM

## 2019-05-15 DIAGNOSIS — E349 Endocrine disorder, unspecified: Secondary | ICD-10-CM

## 2019-05-15 DIAGNOSIS — IMO0001 Reserved for inherently not codable concepts without codable children: Secondary | ICD-10-CM

## 2019-05-16 NOTE — Telephone Encounter (Signed)
To PCP

## 2019-05-18 ENCOUNTER — Ambulatory Visit: Payer: Self-pay | Admitting: Physician Assistant

## 2019-05-28 ENCOUNTER — Ambulatory Visit (INDEPENDENT_AMBULATORY_CARE_PROVIDER_SITE_OTHER): Payer: Self-pay | Admitting: Physician Assistant

## 2019-05-28 ENCOUNTER — Encounter: Payer: Self-pay | Admitting: Physician Assistant

## 2019-05-28 VITALS — HR 74 | Temp 98.4°F | Ht 66.0 in | Wt 190.0 lb

## 2019-05-28 DIAGNOSIS — F411 Generalized anxiety disorder: Secondary | ICD-10-CM

## 2019-05-28 DIAGNOSIS — F640 Transsexualism: Secondary | ICD-10-CM

## 2019-05-28 DIAGNOSIS — IMO0001 Reserved for inherently not codable concepts without codable children: Secondary | ICD-10-CM

## 2019-05-28 DIAGNOSIS — E349 Endocrine disorder, unspecified: Secondary | ICD-10-CM

## 2019-05-28 MED ORDER — SERTRALINE HCL 100 MG PO TABS: 100.0000 mg | ORAL_TABLET | Freq: Every day | ORAL | 1 refills | Status: DC

## 2019-05-28 MED ORDER — MEDROXYPROGESTERONE ACETATE 2.5 MG PO TABS: 2.5000 mg | ORAL_TABLET | Freq: Every day | ORAL | 0 refills | Status: DC

## 2019-05-28 MED ORDER — SPIRONOLACTONE 100 MG PO TABS: 100.0000 mg | ORAL_TABLET | Freq: Every day | ORAL | 0 refills | Status: DC

## 2019-05-28 MED ORDER — ESTRADIOL 2 MG PO TABS: 2.0000 mg | ORAL_TABLET | Freq: Two times a day (BID) | ORAL | 0 refills | Status: DC

## 2019-05-28 NOTE — Progress Notes (Signed)
Virtual Visit via Video Note  I connected with Ethan CamperSean D Volker on 05/28/19 at  2:20 PM EDT by a video enabled telemedicine application and verified that I am speaking with the correct person using two identifiers.   I discussed the limitations of evaluation and management by telemedicine and the availability of in person appointments. The patient expressed understanding and agreed to proceed.  History of Present Illness: HPI:                                                                Ethan Harris is a 33 y.o. adult   CC: medication refills    HRT: Currently taking Spironolactone 100 mg QD (did not tolerate 200 mg) and Estradiol 2 mg bid (did not tolerate 6 mg). She was started on Provera about 5 months ago, but was having issues with the pharmacy. She recently re-started taking this and has noticed breasts are tender and "growing again." She is due for labs next month Labs 06/19/18 Testosterone 289 Estradiol 40    GAD: reports good mood, anxiety symptoms are controlled with Sertraline 100 mg, Alprazolam 0.5 mg twice day, occasionaly takes an extra 0.25 mg for breakthrough anxiety. Rarely takes Trazodone 50 mg prn for insomnia. Takes Melatonin 1.5 mg at bedtime for sleep. Denies depressed mood. Currently caring for partner's grandmother who has dementia and is living with them. Has returned to work part-time.  Allergies: taking Claritin 10 mg. Decided not to take Singulair due to concerns about potential adverse effects. She states symptoms have gotten much better.  [facility] 05/28/2019 11/14/2018 05/06/2018 07/22/2013  Decreased Interest 0 0 0 0  Down, Depressed, Hopeless 1 1 1  0  PHQ - 2 Score 1 1 1  0  Altered sleeping 1 1 2  -  Tired, decreased energy 1 0 1 -  Change in appetite 0 1 2 -  Feeling bad or failure about yourself  0 0 0 -  Trouble concentrating 0 0 0 -  Moving slowly or fidgety/restless 0 0 0 -  Suicidal thoughts 0 0 0 -  PHQ-9 Score 3 3 6  -  Difficult  doing work/chores Somewhat difficult Somewhat difficult Not difficult at all -    GAD 7 : Generalized Anxiety Score 05/28/2019 11/14/2018 05/06/2018  Nervous, Anxious, on Edge 1 1 1   Control/stop worrying 0 0 0  Worry too much - different things 0 0 0  Trouble relaxing 1 1 0  Restless 0 0 0  Easily annoyed or irritable 0 0 1  Afraid - awful might happen 0 0 0  Total GAD 7 Score 2 2 2   Anxiety Difficulty Somewhat difficult Somewhat difficult Not difficult at all      Past Medical History:  Diagnosis Date  . Allergic rhinitis   . Anxiety and depression   . Eczema   . 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 Hypercholesteremia   . HYPERLIPIDEMIA 11/16/2010  . Insomnia   . Urolithiasis 07/22/2013   Past Surgical History:  Procedure Laterality Date  . INJECTION OF SILICONE OIL  2009   lips, cheeks, buttock and thighs  . NO PAST SURGERIES     Social History   Tobacco  Use  . Smoking status: Former Smoker    Packs/day: 0.50    Years: 8.00    Pack years: 4.00    Types: Cigarettes    Quit date: 04/13/2011    Years since quitting: 8.1  . Smokeless tobacco: Never Used  Substance Use Topics  . Alcohol use: Yes    Alcohol/week: 2.0 - 3.0 standard drinks    Types: 2 - 3 Standard drinks or equivalent per week    Comment: Socially   family history includes Depression in her mother; Drug abuse in her maternal grandmother and paternal grandmother; Hypertension in her maternal grandmother; Skin cancer in her maternal grandmother; Stroke in her maternal grandmother.    ROS: negative except as noted in the HPI  Medications: Current Outpatient Medications  Medication Sig Dispense Refill  . albuterol (PROVENTIL HFA;VENTOLIN HFA) 108 (90 Base) MCG/ACT inhaler INHALE 2 PUFFS INTO THE LUNGS EVERY 6 HOURS AS NEEDED FOR WHEEZING OR FOR SHORTNESS OF BREATH 18 each 3  . ALPRAZolam (XANAX) 0.5 MG tablet Take 1 tablet (0.5  mg total) by mouth 2 (two) times daily as needed for anxiety. Due for follow up visit w/PCP in July 60 tablet 0  . betamethasone dipropionate (DIPROLENE) 0.05 % cream Apply 1 application topically 2 (two) times daily as needed.    Marland Kitchen estradiol (ESTRACE) 2 MG tablet Take 1 tablet (2 mg total) by mouth 2 (two) times a day. 180 tablet 0  . ketoconazole (NIZORAL) 2 % cream Apply 1 application topically daily as needed.    . loratadine (CLARITIN) 10 MG tablet Take 10 mg by mouth daily.    . medroxyPROGESTERone (PROVERA) 2.5 MG tablet Take 1 tablet (2.5 mg total) by mouth daily. 90 tablet 0  . MELATONIN PO Take 1-1.5 mg by mouth.    . pantoprazole (PROTONIX) 20 MG tablet Take 1-2 tablets (20-40 mg total) by mouth daily as needed. Take lowest effective dose 60 tablet 2  . sertraline (ZOLOFT) 100 MG tablet Take 1 tablet (100 mg total) by mouth daily. 90 tablet 1  . spironolactone (ALDACTONE) 100 MG tablet Take 1 tablet (100 mg total) by mouth daily. 90 tablet 0  . traZODone (DESYREL) 50 MG tablet Take 1 tablet (50 mg total) by mouth at bedtime as needed for sleep. 30 tablet 1   No current facility-administered medications for this visit.    No Known Allergies     Objective:  Pulse 74   Temp 98.4 F (36.9 C) (Oral)   Ht 5\' 6"  (1.676 m)   Wt 190 lb (86.2 kg)   BMI 30.67 kg/m  Wt Readings from Last 3 Encounters:  05/28/19 190 lb (86.2 kg)  11/14/18 183 lb (83 kg)  07/02/18 186 lb (84.4 kg)   Temp Readings from Last 3 Encounters:  05/28/19 98.4 F (36.9 C) (Oral)  11/14/18 99 F (37.2 C) (Oral)  09/16/17 97.8 F (36.6 C) (Oral)   BP Readings from Last 3 Encounters:  11/14/18 118/67  07/02/18 132/80  05/06/18 122/82   Pulse Readings from Last 3 Encounters:  05/28/19 74  11/14/18 75  07/02/18 80    Gen:  alert, not ill-appearing, no distress, appropriate for age 22: head normocephalic without obvious abnormality, conjunctiva and cornea clear, trachea midline Pulm: Normal work  of breathing, normal phonation Neuro: alert and oriented x 3 Psych: cooperative, euthymic mood, affect mood-congruent, speech is articulate, normal rate and volume; thought processes clear and goal-directed, normal judgment, good insight  Lab Results  Component  Value Date   CREATININE 0.66 05/06/2018   BUN 12 05/06/2018   NA 137 05/06/2018   K 4.3 05/06/2018   CL 102 05/06/2018   CO2 26 05/06/2018   Lab Results  Component Value Date   ALT 21 09/16/2017   AST 16 09/16/2017   ALKPHOS 51 09/16/2017   BILITOT 0.7 09/16/2017     No results found for this or any previous visit (from the past 72 hour(s)). No results found.    Assessment and Plan: 33 y.o. adult with   .Gregary SignsSean was seen today for follow-up.  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 -     medroxyPROGESTERone (PROVERA) 2.5 MG tablet; Take 1 tablet (2.5 mg total) by mouth daily. -     spironolactone (ALDACTONE) 100 MG tablet; Take 1 tablet (100 mg total) by mouth daily.  Generalized anxiety disorder -     sertraline (ZOLOFT) 100 MG tablet; Take 1 tablet (100 mg total) by mouth daily.    Refills provided as above She will go to [facility] within the next 4 weeks for lab monitoring of hormones  Follow-up in 4-6 months for annual physical exam    Follow Up Instructions:    I discussed the assessment and treatment plan with the patient. The patient was provided an opportunity to ask questions and all were answered. The patient agreed with the plan and demonstrated an understanding of the instructions.   The patient was advised to call back or seek an in-person evaluation if the symptoms worsen or if the condition fails to improve as anticipated.  I provided 10 minutes of non-face-to-face time during this encounter.   Carlis Stableharley Elizabeth Cummings, [place]PA-C

## 2019-06-06 ENCOUNTER — Other Ambulatory Visit: Payer: Self-pay | Admitting: Physician Assistant

## 2019-06-06 DIAGNOSIS — F411 Generalized anxiety disorder: Secondary | ICD-10-CM

## 2019-07-20 ENCOUNTER — Other Ambulatory Visit: Payer: Self-pay | Admitting: Physician Assistant

## 2019-07-20 DIAGNOSIS — E349 Endocrine disorder, unspecified: Secondary | ICD-10-CM

## 2019-07-20 DIAGNOSIS — IMO0001 Reserved for inherently not codable concepts without codable children: Secondary | ICD-10-CM

## 2019-07-21 ENCOUNTER — Telehealth: Payer: Self-pay

## 2019-07-21 DIAGNOSIS — F411 Generalized anxiety disorder: Secondary | ICD-10-CM

## 2019-07-21 DIAGNOSIS — E349 Endocrine disorder, unspecified: Secondary | ICD-10-CM

## 2019-07-21 DIAGNOSIS — IMO0001 Reserved for inherently not codable concepts without codable children: Secondary | ICD-10-CM

## 2019-07-21 MED ORDER — ESTRADIOL 2 MG PO TABS: 2.0000 mg | ORAL_TABLET | Freq: Two times a day (BID) | ORAL | 0 refills | Status: DC

## 2019-07-21 MED ORDER — SPIRONOLACTONE 100 MG PO TABS: 100.0000 mg | ORAL_TABLET | Freq: Every day | ORAL | 0 refills | Status: DC

## 2019-07-21 NOTE — Telephone Encounter (Signed)
Patient advised.

## 2019-07-21 NOTE — Telephone Encounter (Signed)
Tia called and states her estradiol was denied. I look in the chart and she is due for labs. I advised her she needs to come in for lab work.   She states she can not come in for multiple reasons.   1- No insurance and can't pay the bill up front 2- She lives with an elderly woman and is worried to expose her to COVID-19.

## 2019-07-21 NOTE — Telephone Encounter (Signed)
I sent in 90 day supply of estradiol and spironolactone Please schedule her for a nurse visit for CMP only.  We can defer hormone labs due to cost, but at a minimum we need to keep an eye on liver, kidneys and electrolytes She can have this done as drive-up to minimize risk (so long as MA/LPN is okay with this)

## 2019-07-23 ENCOUNTER — Ambulatory Visit: Payer: Self-pay

## 2019-07-29 ENCOUNTER — Encounter: Payer: Self-pay | Admitting: Osteopathic Medicine

## 2019-07-29 ENCOUNTER — Ambulatory Visit (INDEPENDENT_AMBULATORY_CARE_PROVIDER_SITE_OTHER): Payer: Self-pay | Admitting: Osteopathic Medicine

## 2019-07-29 DIAGNOSIS — E349 Endocrine disorder, unspecified: Secondary | ICD-10-CM

## 2019-07-29 DIAGNOSIS — F411 Generalized anxiety disorder: Secondary | ICD-10-CM

## 2019-07-29 DIAGNOSIS — F640 Transsexualism: Secondary | ICD-10-CM

## 2019-07-29 DIAGNOSIS — IMO0001 Reserved for inherently not codable concepts without codable children: Secondary | ICD-10-CM

## 2019-07-29 MED ORDER — MEDROXYPROGESTERONE ACETATE 2.5 MG PO TABS: 2.5000 mg | ORAL_TABLET | Freq: Every day | ORAL | 0 refills | Status: DC

## 2019-07-29 MED ORDER — ALPRAZOLAM 0.5 MG PO TABS: 0.5000 mg | ORAL_TABLET | Freq: Two times a day (BID) | ORAL | 2 refills | Status: DC | PRN

## 2019-07-29 MED ORDER — ESTRADIOL 2 MG PO TABS: 2.0000 mg | ORAL_TABLET | Freq: Two times a day (BID) | ORAL | 0 refills | Status: DC

## 2019-07-29 NOTE — Progress Notes (Signed)
Virtual Visit via Video (App used: Doximity) Note  I connected with      Ethan Harris on 07/29/19 at 1:27 PM  by a telemedicine application and verified that I am speaking with the correct person using two identifiers.  Patient is at home I am in office    I discussed the limitations of evaluation and management by telemedicine and the availability of in person appointments. The patient expressed understanding and agreed to proceed.  History of Present Illness: Ethan Harris is a 33 y.o. adult who would like to discuss transfer of care to me, transgender care     Lab orders are in but hasn't been done! Has been a little over a year since hormone levels checked. Almost 2 years since routine labs for metabolic panel and lipids!  Requests change testosterone blocker from spironolactone to bicalutamide.   Finding severe anxiety on some days, would like to request slight increase in alprazolam # if ok to take extra here and there.     Observations/Objective: There were no vitals taken for this visit. BP Readings from Last 3 Encounters:  11/14/18 118/67  07/02/18 132/80  05/06/18 122/82   Exam: Normal Speech.  NAD  Lab and Radiology Results No results found for this or any previous visit (from the past 72 hour(s)). No results found.     Assessment and Plan: 33 y.o. 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   PDMP not reviewed this encounter. No orders of the defined types were placed in this encounter.  Meds ordered this encounter  Medications  . estradiol (ESTRACE) 2 MG tablet    Sig: Take 1 tablet (2 mg total) by mouth 2 (two) times daily.    Dispense:  180 tablet    Refill:  0  . medroxyPROGESTERone (PROVERA) 2.5 MG tablet    Sig: Take 1 tablet (2.5 mg total) by mouth daily.    Dispense:  90 tablet    Refill:  0  . ALPRAZolam (XANAX) 0.5 MG tablet    Sig:  Take 1 tablet (0.5 mg total) by mouth 2 (two) times daily as needed for anxiety (can sparingly take up to tid prn for severe symptoms).    Dispense:  65 tablet    Refill:  2    #65 must last 30 days   Patient Instructions  Given that bicalutamide is less preferred compared to other testosterone-blocking medication and can potentially affect liver function, I'm ok to switch from spironolactone to this medication but need labs done first! Please get blood work done and as long as liver function is in a safe range to start the medication, we can change it. We should also recheck liver function 6 weeks after starting the bicalutamide.   Otherwise, as long as you're doing well and labs are ok, can see me every 6 months!      Instructions sent via MyChart. If MyChart not available, pt was given option for info via personal e-mail w/ no guarantee of protected health info over unsecured e-mail communication, and MyChart sign-up instructions were included.   Follow Up Instructions: Return for need labs! Visit w/ Dr A in 6 mos for annual. .    I discussed the assessment and treatment plan with the patient. The patient was provided an opportunity to ask questions and all were answered. The patient agreed with the plan and demonstrated an understanding of the  instructions.   The patient was advised to call back or seek an in-person evaluation if any new concerns, if symptoms worsen or if the condition fails to improve as anticipated.  25 minutes of non-face-to-face time was provided during this encounter.                      Historical information moved to improve visibility of documentation.  Past Medical History:  Diagnosis Date  . Allergic rhinitis   . Anxiety and depression   . Eczema   . 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 Hypercholesteremia   . HYPERLIPIDEMIA 11/16/2010  . Insomnia   .  Urolithiasis 07/22/2013   Past Surgical History:  Procedure Laterality Date  . INJECTION OF SILICONE OIL  1191   lips, cheeks, buttock and thighs  . NO PAST SURGERIES     Social History   Tobacco Use  . Smoking status: Former Smoker    Packs/day: 0.50    Years: 8.00    Pack years: 4.00    Types: Cigarettes    Quit date: 04/13/2011    Years since quitting: 8.2  . Smokeless tobacco: Never Used  Substance Use Topics  . Alcohol use: Yes    Alcohol/week: 2.0 - 3.0 standard drinks    Types: 2 - 3 Standard drinks or equivalent per week    Comment: Socially   family history includes Depression in her mother; Drug abuse in her maternal grandmother and paternal grandmother; Hypertension in her maternal grandmother; Skin cancer in her maternal grandmother; Stroke in her maternal grandmother.  Medications: Current Outpatient Medications  Medication Sig Dispense Refill  . albuterol (PROVENTIL HFA;VENTOLIN HFA) 108 (90 Base) MCG/ACT inhaler INHALE 2 PUFFS INTO THE LUNGS EVERY 6 HOURS AS NEEDED FOR WHEEZING OR FOR SHORTNESS OF BREATH 18 each 3  . ALPRAZolam (XANAX) 0.5 MG tablet Take 1 tablet (0.5 mg total) by mouth 2 (two) times daily as needed for anxiety. 60 tablet 2  . betamethasone dipropionate (DIPROLENE) 0.05 % cream Apply 1 application topically 2 (two) times daily as needed.    Marland Kitchen estradiol (ESTRACE) 2 MG tablet Take 1 tablet (2 mg total) by mouth 2 (two) times daily. 180 tablet 0  . ketoconazole (NIZORAL) 2 % cream Apply 1 application topically daily as needed.    . loratadine (CLARITIN) 10 MG tablet Take 10 mg by mouth daily.    . medroxyPROGESTERone (PROVERA) 2.5 MG tablet Take 1 tablet (2.5 mg total) by mouth daily. 90 tablet 0  . MELATONIN PO Take 1-1.5 mg by mouth.    . pantoprazole (PROTONIX) 20 MG tablet Take 1-2 tablets (20-40 mg total) by mouth daily as needed. Take lowest effective dose 60 tablet 2  . sertraline (ZOLOFT) 100 MG tablet Take 1 tablet (100 mg total) by mouth  daily. 90 tablet 1  . spironolactone (ALDACTONE) 100 MG tablet Take 1 tablet (100 mg total) by mouth daily. 90 tablet 0  . traZODone (DESYREL) 50 MG tablet Take 1 tablet (50 mg total) by mouth at bedtime as needed for sleep. 30 tablet 1   No current facility-administered medications for this visit.    No Known Allergies  PDMP not reviewed this encounter. No orders of the defined types were placed in this encounter.  No orders of the defined types were placed in this encounter.

## 2019-07-29 NOTE — Patient Instructions (Addendum)
Given that bicalutamide is less preferred compared to other testosterone-blocking medication and can potentially affect liver function, I'm ok to switch from spironolactone to this medication but need labs done first! Please get blood work done and as long as liver function is in a safe range to start the medication, we can change it. We should also recheck liver function 6 weeks after starting the bicalutamide.   Otherwise, as long as you're doing well and labs are ok, can see me every 6 months!

## 2019-08-25 ENCOUNTER — Telehealth: Payer: Self-pay | Admitting: Osteopathic Medicine

## 2019-08-25 NOTE — Telephone Encounter (Signed)
Left a detailed vm msg for pt regarding to complete lab order. Aware of Lab schedule. Direct call back info provided.

## 2019-08-25 NOTE — Telephone Encounter (Signed)
-----   Message from Emeterio Reeve, DO sent at 07/30/2019 11:36 PM EDT ----- Has she gotten labs done? Need to confirm ok liver panel prior to Rx bicalutamide in place of spironolactone

## 2019-10-20 ENCOUNTER — Encounter: Payer: Self-pay | Admitting: Osteopathic Medicine

## 2019-10-22 ENCOUNTER — Other Ambulatory Visit: Payer: Self-pay | Admitting: Physician Assistant

## 2019-10-22 DIAGNOSIS — F411 Generalized anxiety disorder: Secondary | ICD-10-CM

## 2019-10-26 NOTE — Telephone Encounter (Signed)
Patient wanted to know if she can have blood work done in office since she is currently unemployed due to the pandemic. She states that [facility] is asking for money upfront and she does not have that right now. Wanted to know if at her upcoming appointment she can get this done. This is for estrogen, testosterone and [facility] check. Please advise.

## 2019-11-01 ENCOUNTER — Encounter: Payer: Self-pay | Admitting: Osteopathic Medicine

## 2019-11-02 ENCOUNTER — Ambulatory Visit: Payer: Self-pay | Admitting: Osteopathic Medicine

## 2019-11-02 NOTE — Telephone Encounter (Signed)
Left patient a voicemail with information below. Let patient know to call us back to reschedule appointment.

## 2019-11-08 ENCOUNTER — Other Ambulatory Visit: Payer: Self-pay | Admitting: Physician Assistant

## 2019-11-08 DIAGNOSIS — R1013 Epigastric pain: Secondary | ICD-10-CM

## 2019-11-09 ENCOUNTER — Telehealth: Payer: Self-pay

## 2019-11-09 DIAGNOSIS — F411 Generalized anxiety disorder: Secondary | ICD-10-CM

## 2019-11-09 MED ORDER — ALPRAZOLAM 0.5 MG PO TABS: 0.5000 mg | ORAL_TABLET | Freq: Two times a day (BID) | ORAL | 2 refills | Status: DC | PRN

## 2019-11-09 MED ORDER — ALPRAZOLAM 0.5 MG PO TABS: 0.5000 mg | ORAL_TABLET | Freq: Three times a day (TID) | ORAL | 2 refills | Status: DC | PRN

## 2019-11-09 NOTE — Telephone Encounter (Signed)
Ethan Harris requesting med refill for alprazolam 0.5mg , #65. Rx not listed in active med list. Pls advise, thanks.

## 2019-12-05 ENCOUNTER — Other Ambulatory Visit: Payer: Self-pay | Admitting: Internal Medicine

## 2020-02-02 ENCOUNTER — Other Ambulatory Visit: Payer: Self-pay | Admitting: Osteopathic Medicine

## 2020-02-02 DIAGNOSIS — R1013 Epigastric pain: Secondary | ICD-10-CM

## 2020-02-02 DIAGNOSIS — F411 Generalized anxiety disorder: Secondary | ICD-10-CM

## 2020-02-10 ENCOUNTER — Other Ambulatory Visit: Payer: Self-pay | Admitting: Osteopathic Medicine

## 2020-02-10 DIAGNOSIS — IMO0001 Reserved for inherently not codable concepts without codable children: Secondary | ICD-10-CM

## 2020-02-10 DIAGNOSIS — E349 Endocrine disorder, unspecified: Secondary | ICD-10-CM

## 2020-02-10 DIAGNOSIS — F411 Generalized anxiety disorder: Secondary | ICD-10-CM

## 2020-02-12 ENCOUNTER — Other Ambulatory Visit: Payer: Self-pay | Admitting: Physician Assistant

## 2020-02-12 DIAGNOSIS — E349 Endocrine disorder, unspecified: Secondary | ICD-10-CM

## 2020-02-12 DIAGNOSIS — IMO0001 Reserved for inherently not codable concepts without codable children: Secondary | ICD-10-CM

## 2020-03-16 ENCOUNTER — Other Ambulatory Visit: Payer: Self-pay | Admitting: Osteopathic Medicine

## 2020-03-16 DIAGNOSIS — R1013 Epigastric pain: Secondary | ICD-10-CM

## 2020-03-18 ENCOUNTER — Other Ambulatory Visit: Payer: Self-pay

## 2020-03-18 DIAGNOSIS — R1013 Epigastric pain: Secondary | ICD-10-CM

## 2020-03-18 MED ORDER — PANTOPRAZOLE SODIUM 20 MG PO TBEC
20.0000 mg | DELAYED_RELEASE_TABLET | Freq: Every day | ORAL | 0 refills | Status: DC | PRN
Start: 2020-03-18 — End: 2020-03-18

## 2020-03-18 MED ORDER — PANTOPRAZOLE SODIUM 20 MG PO TBEC: 20.0000 mg | DELAYED_RELEASE_TABLET | Freq: Every day | ORAL | 0 refills | Status: DC | PRN

## 2020-04-05 ENCOUNTER — Other Ambulatory Visit: Payer: Self-pay | Admitting: Osteopathic Medicine

## 2020-04-05 DIAGNOSIS — F411 Generalized anxiety disorder: Secondary | ICD-10-CM

## 2020-04-05 NOTE — Telephone Encounter (Signed)
Ethan Harris pharmacy requesting med refill for alprazolam. Last written on 02/02/20.

## 2020-04-21 ENCOUNTER — Other Ambulatory Visit: Payer: Self-pay | Admitting: Osteopathic Medicine

## 2020-04-21 DIAGNOSIS — IMO0001 Reserved for inherently not codable concepts without codable children: Secondary | ICD-10-CM

## 2020-05-08 ENCOUNTER — Other Ambulatory Visit: Payer: Self-pay | Admitting: Osteopathic Medicine

## 2020-05-08 DIAGNOSIS — F411 Generalized anxiety disorder: Secondary | ICD-10-CM

## 2020-05-08 DIAGNOSIS — IMO0001 Reserved for inherently not codable concepts without codable children: Secondary | ICD-10-CM

## 2020-05-12 ENCOUNTER — Other Ambulatory Visit: Payer: Self-pay

## 2020-05-12 DIAGNOSIS — F411 Generalized anxiety disorder: Secondary | ICD-10-CM

## 2020-05-12 NOTE — Telephone Encounter (Signed)
Pt called requesting a med refill for alprazolam. Pt has an upcoming appt on 05/18/20. Last written 04/07/20. Rx pended. Pls send refill to [facility].

## 2020-05-13 MED ORDER — ALPRAZOLAM 0.5 MG PO TABS: ORAL_TABLET | ORAL | 0 refills | Status: DC

## 2020-05-13 NOTE — Telephone Encounter (Signed)
Task completed. Pt has been updated that alprazolam refill sent to the pharmacy. No other inquiries during the call.

## 2020-05-18 ENCOUNTER — Encounter: Payer: Self-pay | Admitting: Osteopathic Medicine

## 2020-05-18 ENCOUNTER — Other Ambulatory Visit: Payer: Self-pay

## 2020-05-18 ENCOUNTER — Ambulatory Visit (INDEPENDENT_AMBULATORY_CARE_PROVIDER_SITE_OTHER): Payer: Self-pay | Admitting: Osteopathic Medicine

## 2020-05-18 VITALS — BP 117/74 | HR 76 | Wt 193.0 lb

## 2020-05-18 DIAGNOSIS — E349 Endocrine disorder, unspecified: Secondary | ICD-10-CM

## 2020-05-18 DIAGNOSIS — R1013 Epigastric pain: Secondary | ICD-10-CM

## 2020-05-18 DIAGNOSIS — F411 Generalized anxiety disorder: Secondary | ICD-10-CM

## 2020-05-18 DIAGNOSIS — IMO0001 Reserved for inherently not codable concepts without codable children: Secondary | ICD-10-CM

## 2020-05-18 DIAGNOSIS — F640 Transsexualism: Secondary | ICD-10-CM

## 2020-05-18 LAB — COMPLETE METABOLIC PANEL WITH GFR
AG Ratio: 1.7 (calc) (ref 1.0–2.5)
ALT: 21 U/L (ref 9–46)
AST: 16 U/L (ref 10–40)
Albumin: 4.3 g/dL (ref 3.6–5.1)
Alkaline phosphatase (APISO): 50 U/L (ref 36–130)
BUN: 14 mg/dL (ref 7–25)
CO2: 27 mmol/L (ref 20–32)
Calcium: 9.5 mg/dL (ref 8.6–10.3)
Chloride: 103 mmol/L (ref 98–110)
Creat: 0.62 mg/dL (ref 0.60–1.35)
GFR, Est African American: 149 mL/min/{1.73_m2} (ref >=60)
GFR, Est Non African American: 129 mL/min/{1.73_m2} (ref >=60)
Globulin: 2.6 g/dL (calc) (ref 1.9–3.7)
Glucose, Bld: 85 mg/dL (ref 65–99)
Potassium: 4.5 mmol/L (ref 3.5–5.3)
Sodium: 138 mmol/L (ref 135–146)
Total Bilirubin: 0.3 mg/dL (ref 0.2–1.2)
Total Protein: 6.9 g/dL (ref 6.1–8.1)

## 2020-05-18 MED ORDER — SERTRALINE HCL 100 MG PO TABS: 100.0000 mg | ORAL_TABLET | Freq: Every day | ORAL | 3 refills | Status: DC

## 2020-05-18 MED ORDER — BETAMETHASONE DIPROPIONATE 0.05 % EX CREA: 1.0000 "application " | TOPICAL_CREAM | Freq: Two times a day (BID) | CUTANEOUS | 2 refills | Status: DC | PRN

## 2020-05-18 MED ORDER — PANTOPRAZOLE SODIUM 20 MG PO TBEC: 20.0000 mg | DELAYED_RELEASE_TABLET | Freq: Every day | ORAL | 3 refills | Status: DC | PRN

## 2020-05-18 NOTE — Progress Notes (Signed)
Ethan Harris is a 34 y.o. adult who presents to  [facility]  today, 05/18/20, seeking care for the following:  Marland Kitchen Maintain Rx - overdue for labs! Last CMP 2019. Otherwise patient is doing well with current Rx regimen for mental health, GERD, hormones.      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 Diagnoses of Generalized anxiety disorder and Dyspepsia were also pertinent to this visit.      There are no Patient Instructions on file for this visit.  Orders Placed This Encounter  Procedures  . COMPLETE METABOLIC PANEL WITH GFR    Meds ordered this encounter  Medications  . sertraline (ZOLOFT) 100 MG tablet    Sig: Take 1 tablet (100 mg total) by mouth daily.    Dispense:  90 tablet    Refill:  3  . pantoprazole (PROTONIX) 20 MG tablet    Sig: Take 1-2 tablets (20-40 mg total) by mouth daily as needed. *TAKE LOWEST EFFECTIVE DOSE*    Dispense:  180 tablet    Refill:  3  . betamethasone dipropionate 0.05 % cream    Sig: Apply 1 application topically 2 (two) times daily as needed. For eczema    Dispense:  30 g    Refill:  2       Follow-up instructions: Return in about 1 year (around 05/18/2021) for ANNUAL CHECK-UP AS LONG AS LABS OK 05/2020. SEE ME SOONER IF NEEDED! Marland Kitchen Financial barriers to care - ok to do annual check-ups and labs                                          BP 117/74 (BP Location: Right Arm, Patient Position: Sitting)   Pulse 76   Wt 193 lb (87.5 kg)   SpO2 99%   BMI 31.15 kg/m   Current Meds  Medication Sig  . albuterol (VENTOLIN HFA) 108 (90 Base) MCG/ACT inhaler INHALE TWO PUFFS BY MOUTH INTO THE LUNGS EVERY 6 HOURS AS NEEDED FOR WHEEZING OR FOR SHORTNESS OF BREATH  . ALPRAZolam (XANAX) 0.5 MG tablet TAKE ONE TABLET BY MOUTH THREE TIMES A DAY AS NEEDED FOR  ANXIETY **MUST LAST 30 DAYS**  . betamethasone dipropionate 0.05 % cream Apply 1 application topically 2 (two) times daily as needed. For eczema  . estradiol (ESTRACE) 2 MG tablet Take 1 tablet (2 mg total) by mouth 2 (two) times daily.  Marland Kitchen loratadine (CLARITIN) 10 MG tablet Take 10 mg by mouth daily.  . medroxyPROGESTERone (PROVERA) 2.5 MG tablet TAKE ONE TABLET BY MOUTH DAILY  . MELATONIN PO Take 1-1.5 mg by mouth.  . pantoprazole (PROTONIX) 20 MG tablet Take 1-2 tablets (20-40 mg total) by mouth daily as needed. *TAKE LOWEST EFFECTIVE DOSE*  . sertraline (ZOLOFT) 100 MG tablet Take 1 tablet (100 mg total) by mouth daily.  Marland Kitchen spironolactone (ALDACTONE) 100 MG tablet TAKE ONE TABLET BY MOUTH DAILY  . traZODone (DESYREL) 50 MG tablet Take 1 tablet (50 mg total) by mouth at bedtime as needed for sleep.  . [DISCONTINUED] betamethasone dipropionate (DIPROLENE) 0.05 % cream Apply 1 application topically 2 (two) times daily as needed.  . [DISCONTINUED] ketoconazole (NIZORAL) 2 % cream Apply 1 application topically daily as needed.  . [DISCONTINUED] pantoprazole (PROTONIX) 20 MG tablet Take  1-2 tablets (20-40 mg total) by mouth daily as needed. *TAKE LOWEST EFFECTIVE DOSE**PATIENT NEEDS OFFICE VISIT FOR ADDITIONAL REFILLS**  . [DISCONTINUED] sertraline (ZOLOFT) 100 MG tablet TAKE ONE TABLET BY MOUTH DAILY    No results found for this or any previous visit (from the past 72 hour(s)).  No results found.     All questions at time of visit were answered - patient instructed to contact office with any additional concerns or updates.  ER/RTC precautions were reviewed with the patient as applicable.   Please note: voice recognition software was used to produce this document, and typos may escape review. Please contact Dr. Lyn Hollingshead for any needed clarifications.   Total encounter time: 20 minutes.

## 2020-06-13 ENCOUNTER — Other Ambulatory Visit: Payer: Self-pay | Admitting: Osteopathic Medicine

## 2020-06-13 DIAGNOSIS — F411 Generalized anxiety disorder: Secondary | ICD-10-CM

## 2020-06-15 ENCOUNTER — Telehealth: Payer: Self-pay

## 2020-06-15 DIAGNOSIS — F411 Generalized anxiety disorder: Secondary | ICD-10-CM

## 2020-06-15 NOTE — Telephone Encounter (Signed)
Patient left message on voicemail requesting medication refill. Refill has been sent to pharmacy.

## 2020-06-15 NOTE — Telephone Encounter (Signed)
30 days sent w/ 2 refills

## 2020-06-15 NOTE — Telephone Encounter (Signed)
Patient called inquiring about refill.   Last RX sent 05/13/20 for #65, 1 PO TID PRN ANX to last 30 days  RX pended, please send if OK . Patient has asked for call once this has been sent.

## 2020-06-16 NOTE — Telephone Encounter (Signed)
Pt advised via VM 

## 2020-06-20 ENCOUNTER — Other Ambulatory Visit: Payer: Self-pay | Admitting: Osteopathic Medicine

## 2020-06-20 DIAGNOSIS — IMO0001 Reserved for inherently not codable concepts without codable children: Secondary | ICD-10-CM

## 2020-06-27 ENCOUNTER — Other Ambulatory Visit: Payer: Self-pay | Admitting: Physician Assistant

## 2020-06-27 DIAGNOSIS — IMO0001 Reserved for inherently not codable concepts without codable children: Secondary | ICD-10-CM

## 2020-06-27 DIAGNOSIS — E349 Endocrine disorder, unspecified: Secondary | ICD-10-CM

## 2020-09-12 ENCOUNTER — Other Ambulatory Visit: Payer: Self-pay | Admitting: Osteopathic Medicine

## 2020-09-12 DIAGNOSIS — F411 Generalized anxiety disorder: Secondary | ICD-10-CM

## 2020-10-25 ENCOUNTER — Other Ambulatory Visit: Payer: Self-pay | Admitting: Osteopathic Medicine

## 2020-10-25 DIAGNOSIS — IMO0001 Reserved for inherently not codable concepts without codable children: Secondary | ICD-10-CM

## 2020-11-14 ENCOUNTER — Other Ambulatory Visit: Payer: Self-pay | Admitting: Osteopathic Medicine

## 2020-11-14 DIAGNOSIS — F411 Generalized anxiety disorder: Secondary | ICD-10-CM

## 2020-11-16 ENCOUNTER — Other Ambulatory Visit: Payer: Self-pay

## 2020-11-16 DIAGNOSIS — F411 Generalized anxiety disorder: Secondary | ICD-10-CM

## 2020-11-16 NOTE — Telephone Encounter (Signed)
Last written 09/13/2020 #65 with 1 refill Last appt 05/18/2020 Lyn Hollingshead pt

## 2020-12-16 ENCOUNTER — Other Ambulatory Visit: Payer: Self-pay

## 2020-12-16 DIAGNOSIS — F411 Generalized anxiety disorder: Secondary | ICD-10-CM

## 2020-12-16 MED ORDER — ALPRAZOLAM 0.5 MG PO TABS
ORAL_TABLET | ORAL | 0 refills | Status: DC
Start: 2020-12-16 — End: 2021-01-16

## 2020-12-16 NOTE — Telephone Encounter (Signed)
Pt called requesting med refills for alprazolam. Per pt, she is leaving town today and would like to pick up the rx before leaving. Rx pended.

## 2020-12-27 ENCOUNTER — Encounter: Payer: Self-pay | Admitting: Osteopathic Medicine

## 2021-01-13 ENCOUNTER — Other Ambulatory Visit: Payer: Self-pay | Admitting: Osteopathic Medicine

## 2021-01-13 DIAGNOSIS — F411 Generalized anxiety disorder: Secondary | ICD-10-CM

## 2021-03-04 ENCOUNTER — Other Ambulatory Visit: Payer: Self-pay | Admitting: Osteopathic Medicine

## 2021-03-04 DIAGNOSIS — E349 Endocrine disorder, unspecified: Secondary | ICD-10-CM

## 2021-03-04 DIAGNOSIS — Z789 Other specified health status: Secondary | ICD-10-CM

## 2021-03-04 DIAGNOSIS — IMO0001 Reserved for inherently not codable concepts without codable children: Secondary | ICD-10-CM

## 2021-03-06 ENCOUNTER — Other Ambulatory Visit: Payer: Self-pay

## 2021-03-06 DIAGNOSIS — IMO0001 Reserved for inherently not codable concepts without codable children: Secondary | ICD-10-CM

## 2021-03-06 DIAGNOSIS — Z789 Other specified health status: Secondary | ICD-10-CM

## 2021-03-06 MED ORDER — ESTRADIOL 2 MG PO TABS: 2.0000 mg | ORAL_TABLET | Freq: Two times a day (BID) | ORAL | 0 refills | Status: DC

## 2021-04-14 ENCOUNTER — Other Ambulatory Visit: Payer: Self-pay | Admitting: Osteopathic Medicine

## 2021-04-14 DIAGNOSIS — F411 Generalized anxiety disorder: Secondary | ICD-10-CM

## 2021-04-16 DIAGNOSIS — M719 Bursopathy, unspecified: Secondary | ICD-10-CM | POA: Insufficient documentation

## 2021-05-24 ENCOUNTER — Encounter: Payer: Self-pay | Admitting: Osteopathic Medicine

## 2021-05-25 ENCOUNTER — Ambulatory Visit (INDEPENDENT_AMBULATORY_CARE_PROVIDER_SITE_OTHER): Payer: Self-pay | Admitting: Osteopathic Medicine

## 2021-05-25 ENCOUNTER — Encounter: Payer: Self-pay | Admitting: Osteopathic Medicine

## 2021-05-25 ENCOUNTER — Other Ambulatory Visit: Payer: Self-pay

## 2021-05-25 VITALS — BP 121/74 | HR 77 | Ht 66.0 in | Wt 190.4 lb

## 2021-05-25 DIAGNOSIS — J4520 Mild intermittent asthma, uncomplicated: Secondary | ICD-10-CM

## 2021-05-25 DIAGNOSIS — F411 Generalized anxiety disorder: Secondary | ICD-10-CM

## 2021-05-25 DIAGNOSIS — E349 Endocrine disorder, unspecified: Secondary | ICD-10-CM

## 2021-05-25 DIAGNOSIS — Z5181 Encounter for therapeutic drug level monitoring: Secondary | ICD-10-CM

## 2021-05-25 DIAGNOSIS — Z0000 Encounter for general adult medical examination without abnormal findings: Secondary | ICD-10-CM

## 2021-05-25 DIAGNOSIS — Z789 Other specified health status: Secondary | ICD-10-CM

## 2021-05-25 DIAGNOSIS — R1013 Epigastric pain: Secondary | ICD-10-CM

## 2021-05-25 DIAGNOSIS — Z79899 Other long term (current) drug therapy: Secondary | ICD-10-CM

## 2021-05-25 DIAGNOSIS — IMO0001 Reserved for inherently not codable concepts without codable children: Secondary | ICD-10-CM

## 2021-05-25 DIAGNOSIS — E785 Hyperlipidemia, unspecified: Secondary | ICD-10-CM

## 2021-05-25 MED ORDER — ESTRADIOL 2 MG PO TABS: 2.0000 mg | ORAL_TABLET | Freq: Every day | ORAL | 3 refills | Status: DC

## 2021-05-25 MED ORDER — SPIRONOLACTONE 100 MG PO TABS: 100.0000 mg | ORAL_TABLET | Freq: Every day | ORAL | 3 refills | Status: DC

## 2021-05-25 MED ORDER — PANTOPRAZOLE SODIUM 20 MG PO TBEC: 20.0000 mg | DELAYED_RELEASE_TABLET | Freq: Every day | ORAL | 3 refills | Status: DC | PRN

## 2021-05-25 MED ORDER — ALPRAZOLAM 0.5 MG PO TABS: ORAL_TABLET | ORAL | 2 refills | Status: DC

## 2021-05-25 MED ORDER — SERTRALINE HCL 100 MG PO TABS: 100.0000 mg | ORAL_TABLET | Freq: Every day | ORAL | 3 refills | Status: DC

## 2021-05-25 MED ORDER — MEDROXYPROGESTERONE ACETATE 2.5 MG PO TABS: 2.5000 mg | ORAL_TABLET | Freq: Every day | ORAL | 3 refills | Status: DC

## 2021-05-25 NOTE — Patient Instructions (Signed)
General Preventive Care Most recent routine screening labs: ordered.  Blood pressure goal 130/80 or less.  Tobacco: don't!  Alcohol: responsible moderation is ok for most adults - if you have concerns about your alcohol intake, please talk to me!  Exercise: as tolerated to reduce risk of cardiovascular disease and diabetes. Strength training will also prevent osteoporosis.  Mental health: if need for mental health care (medicines, counseling, other), or concerns about moods, please let me know!  Sexual / Reproductive health: if need for STI testing, or if concerns with libido/pain problems, please let me know! If you need to discuss family planning, please let me know!  Advanced Directive: Living Will and/or Healthcare Power of Attorney recommended for all adults, regardless of age or health.  Vaccines Flu vaccine: for almost everyone, every fall.  Shingles vaccine: after age 97.  Pneumonia vaccines: after age 39. Tetanus booster: every 10 years, due 2023 HPV vaccine: Gardasil up to age 55  COVID vaccine: STRONGLY RECOMMENDED  Cancer screenings  Colon cancer screening: for everyone age 10 Prostate cancer screening: PSA blood test age 24 Breast cancer screening: mammogram at age 72  Lung cancer screening: shouldn't be needed as long as you stay non-smoking!  Infection screenings  HIV: recommended screening at least once age 57-65, more often as needed. Gonorrhea/Chlamydia: screening as needed Hepatitis C: recommended once for everyone age 53-75 TB: certain at-risk populations, work requirements and/or travel history Other Bone Density Test: recommended at age 62 Abdominal Aortic Aneurysm: screening with ultrasound recommended once at age 69

## 2021-05-25 NOTE — Progress Notes (Signed)
Ethan Harris is a 35 y.o. adult who presents to  [place] [facility]  today,05/25/21, seeking care for the following:  Annual physical / preventive care Due for labs and refills  Doing well otherwise! Has 4 dogs now!      ASSESSMENT & PLAN with other pertinent findings:  The primary encounter diagnosis was Annual physical exam. 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    Patient Instructions  General Preventive Care Most recent routine screening labs: ordered.  Blood pressure goal 130/80 or less.  Tobacco: don't!  Alcohol: responsible moderation is ok for most adults - if you have concerns about your alcohol intake, please talk to me!  Exercise: as tolerated to reduce risk of cardiovascular disease and diabetes. Strength training will also prevent osteoporosis.  Mental health: if need for mental health care (medicines, counseling, other), or concerns about moods, please let me know!  Sexual / Reproductive health: if need for STI testing, or if concerns with libido/pain problems, please let me know! If you need to discuss family planning, please let me know!  Advanced Directive: Living Will and/or Healthcare Power of Attorney recommended for all adults, regardless of age or health.  Vaccines Flu vaccine: for almost everyone, every fall.  Shingles vaccine: after age 59.  Pneumonia vaccines: after age 38. Tetanus booster: every 10 years, due 2023 HPV vaccine: Gardasil up to age 53  COVID vaccine: Philip screenings  Colon cancer screening: for everyone age 69 Prostate cancer screening: PSA blood test age  23 Breast cancer screening: mammogram at age 62  Lung cancer screening: shouldn't be needed as long as you stay non-smoking!  Infection screenings  HIV: recommended screening at least once age 58-65, more often as needed. Gonorrhea/Chlamydia: screening as needed Hepatitis C: recommended once for everyone age 32-12 TB: certain at-risk populations, work requirements and/or travel history Other Bone Density Test: recommended at age 57 Abdominal Aortic Aneurysm: screening with ultrasound recommended once at age 63  Orders Placed This Encounter  Procedures   BASIC METABOLIC PANEL WITH GFR   CBC   Lipid panel   Testosterone   Estrogens, total    Meds ordered this encounter  Medications   ALPRAZolam (XANAX) 0.5 MG tablet    Sig: TAKE ONE TABLET BY MOUTH THREE TIMES A DAY AS NEEDED FOR ANXIETY **MUST LAST 30 DAYS**    Dispense:  65 tablet    Refill:  2   estradiol (ESTRACE) 2 MG tablet    Sig: Take 1 tablet (2 mg total) by mouth daily.    Dispense:  180 tablet    Refill:  3   medroxyPROGESTERone (PROVERA) 2.5 MG tablet    Sig: Take 1 tablet (2.5 mg total) by mouth daily.    Dispense:  90 tablet    Refill:  3   pantoprazole (PROTONIX) 20 MG tablet    Sig: Take 1-2 tablets (20-40 mg total) by mouth daily as needed. *TAKE LOWEST EFFECTIVE DOSE*    Dispense:  180 tablet    Refill:  3   sertraline (ZOLOFT) 100 MG tablet    Sig: Take 1 tablet (100 mg total) by mouth daily.    Dispense:  90 tablet    Refill:  3  spironolactone (ALDACTONE) 100 MG tablet    Sig: Take 1 tablet (100 mg total) by mouth daily.    Dispense:  90 tablet    Refill:  3      See below for relevant physical exam findings  See below for recent lab and imaging results reviewed  Medications, allergies, PMH, PSH, SocH, FamH reviewed below    Follow-up instructions: Return in about 1 year (around 05/25/2022) for [place] (call week prior to visit for lab orders). If able, should monitor labs in 6 months  (11/2021).                                        Exam:  BP 121/74   Pulse 77   Ht '5\' 6"'  (1.676 m)   Wt 190 lb 6.4 oz (86.4 kg)   SpO2 98%   BMI 30.73 kg/m  Constitutional: VS see above. General Appearance: alert, well-developed, well-nourished, NAD Neck: No masses, trachea midline.  Respiratory: Normal respiratory effort. no wheeze, no rhonchi, no rales Cardiovascular: S1/S2 normal, no murmur, no rub/gallop auscultated. RRR.  Musculoskeletal: Gait normal. Symmetric and independent movement of all extremities Abdominal: non-tender, non-distended, no appreciable organomegaly, neg Murphy's, BS WNLx4 Neurological: Normal balance/coordination. No tremor. Skin: warm, dry, intact.  Psychiatric: Normal judgment/insight. Normal mood and affect. Oriented x3.   Current Meds  Medication Sig   albuterol (VENTOLIN HFA) 108 (90 Base) MCG/ACT inhaler INHALE TWO PUFFS BY MOUTH INTO THE LUNGS EVERY 6 HOURS AS NEEDED FOR WHEEZING OR FOR SHORTNESS OF BREATH   betamethasone dipropionate 0.05 % cream Apply 1 application topically 2 (two) times daily as needed. For eczema   loratadine (CLARITIN) 10 MG tablet Take 10 mg by mouth daily.   MELATONIN PO Take 1-1.5 mg by mouth.   traZODone (DESYREL) 50 MG tablet Take 1 tablet (50 mg total) by mouth at bedtime as needed for sleep.   [DISCONTINUED] ALPRAZolam (XANAX) 0.5 MG tablet TAKE ONE TABLET BY MOUTH THREE TIMES A DAY AS NEEDED FOR ANXIETY **MUST LAST 30 DAYS**   [DISCONTINUED] estradiol (ESTRACE) 2 MG tablet Take 1 tablet (2 mg total) by mouth 2 (two) times daily.   [DISCONTINUED] medroxyPROGESTERone (PROVERA) 2.5 MG tablet TAKE ONE TABLET BY MOUTH DAILY   [DISCONTINUED] pantoprazole (PROTONIX) 20 MG tablet Take 1-2 tablets (20-40 mg total) by mouth daily as needed. *TAKE LOWEST EFFECTIVE DOSE*   [DISCONTINUED] sertraline (ZOLOFT) 100 MG tablet Take 1 tablet (100 mg total) by mouth daily.   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 05/06/2018   Urolithiasis 07/22/2013   Annual physical exam 04/09/2012   Dyslipidemia 11/16/2010   ALLERGIC RHINITIS 05/02/2007   Asthma, mild intermittent 05/02/2007    Family History  Problem Relation Age of Onset   Depression Mother    Hypertension Maternal Grandmother    Stroke Maternal Grandmother    Skin cancer Maternal Grandmother    Drug abuse Maternal Grandmother    Drug abuse Paternal Grandmother    Colon cancer Neg Hx    Prostate cancer Neg  Hx    Diabetes Neg Hx    Heart attack Neg Hx     Social History   Tobacco Use  Smoking Status Former   Packs/day: 0.50   Years: 8.00   Pack years: 4.00   Types: Cigarettes   Quit date: 04/13/2011   Years since quitting: 10.1  Smokeless Tobacco Never    Past Surgical History:  Procedure Laterality Date   INJECTION OF SILICONE OIL  7616   lips, cheeks, buttock and thighs   NO PAST SURGERIES      Immunization History  Administered Date(s) Administered   DTaP 05/28/1985, 07/31/1985, 09/25/1985, 02/24/1987, 02/17/1990   Hepatitis B 08/24/1996, 10/05/1996, 02/01/1997   HiB (PRP-OMP) 02/24/1987   IPV 05/28/1985, 07/31/1985, 09/25/1985, 02/24/1987, 02/17/1990   MMR 06/28/1986, 12/18/2001   Td 11/12/2000   Tdap 04/09/2012    No results found for this or any previous visit (from the past 2160 hour(s)).  No results found.     All questions at time of visit were answered - patient instructed to contact office with any additional concerns or updates. ER/RTC precautions were reviewed with the patient as  applicable.   Please note: manual typing as well as voice recognition software may have been used to produce this document - typos may escape review. Please contact Dr. Sheppard Coil for any needed clarifications.

## 2021-06-02 LAB — ESTROGENS, TOTAL: Estrogen: 1876.4 pg/mL — ABNORMAL HIGH (ref 60–190)

## 2021-06-02 LAB — CBC
HCT: 41.5 % (ref 38.5–50.0)
Hemoglobin: 13.8 g/dL (ref 13.2–17.1)
MCH: 28.6 pg (ref 27.0–33.0)
MCHC: 33.3 g/dL (ref 32.0–36.0)
MCV: 85.9 fL (ref 80.0–100.0)
MPV: 10.8 fL (ref 7.5–12.5)
Platelets: 300 10*3/uL (ref 140–400)
RBC: 4.83 10*6/uL (ref 4.20–5.80)
RDW: 13.1 % (ref 11.0–15.0)
WBC: 7.5 10*3/uL (ref 3.8–10.8)

## 2021-06-02 LAB — BASIC METABOLIC PANEL WITH GFR
BUN/Creatinine Ratio: 20 (calc) (ref 6–22)
BUN: 11 mg/dL (ref 7–25)
CO2: 25 mmol/L (ref 20–32)
Calcium: 9.2 mg/dL (ref 8.6–10.3)
Chloride: 103 mmol/L (ref 98–110)
Creat: 0.56 mg/dL — ABNORMAL LOW (ref 0.60–1.26)
Glucose, Bld: 90 mg/dL (ref 65–99)
Potassium: 4.6 mmol/L (ref 3.5–5.3)
Sodium: 138 mmol/L (ref 135–146)
eGFR: 131 mL/min/{1.73_m2} (ref >=60)

## 2021-06-02 LAB — LIPID PANEL
Cholesterol: 244 mg/dL — ABNORMAL HIGH (ref <200)
HDL: 47 mg/dL (ref >=40)
LDL Cholesterol (Calc): 159 mg/dL (calc) — ABNORMAL HIGH
Non-HDL Cholesterol (Calc): 197 mg/dL (calc) — ABNORMAL HIGH (ref <130)
Total CHOL/HDL Ratio: 5.2 (calc) — ABNORMAL HIGH (ref <5.0)
Triglycerides: 225 mg/dL — ABNORMAL HIGH (ref <150)

## 2021-06-02 LAB — TESTOSTERONE: Testosterone: 521 ng/dL (ref 250–827)

## 2021-09-21 ENCOUNTER — Telehealth: Payer: Self-pay

## 2021-09-21 NOTE — Telephone Encounter (Signed)
Outbound call made to offer 2nd MPX vaccine. Left patient HIPPA compliant voicemail to return office call. Valarie Cones

## 2021-10-16 ENCOUNTER — Other Ambulatory Visit: Payer: Self-pay | Admitting: Osteopathic Medicine

## 2021-10-16 DIAGNOSIS — F411 Generalized anxiety disorder: Secondary | ICD-10-CM

## 2021-10-16 NOTE — Telephone Encounter (Signed)
Routing to covering provider.  °

## 2021-11-13 ENCOUNTER — Other Ambulatory Visit: Payer: Self-pay | Admitting: Osteopathic Medicine

## 2021-11-13 DIAGNOSIS — F411 Generalized anxiety disorder: Secondary | ICD-10-CM

## 2021-11-14 NOTE — Telephone Encounter (Signed)
Routing to covering provider.  °

## 2021-11-14 NOTE — Telephone Encounter (Signed)
Pt called to schedule transfer of care visit (01/25 w/Joy). Pt is in need of refill on xanax,.5mg  tab. Pharmacy on file is correct.

## 2021-11-28 ENCOUNTER — Ambulatory Visit: Payer: Self-pay | Admitting: Family Medicine

## 2021-12-06 ENCOUNTER — Other Ambulatory Visit: Payer: Self-pay

## 2021-12-06 ENCOUNTER — Ambulatory Visit (INDEPENDENT_AMBULATORY_CARE_PROVIDER_SITE_OTHER): Payer: Self-pay | Admitting: Medical-Surgical

## 2021-12-06 ENCOUNTER — Encounter: Payer: Self-pay | Admitting: Medical-Surgical

## 2021-12-06 VITALS — BP 124/78 | HR 71 | Resp 20 | Ht 66.0 in | Wt 189.0 lb

## 2021-12-06 DIAGNOSIS — Z7689 Persons encountering health services in other specified circumstances: Secondary | ICD-10-CM

## 2021-12-06 DIAGNOSIS — F411 Generalized anxiety disorder: Secondary | ICD-10-CM

## 2021-12-06 DIAGNOSIS — Z789 Other specified health status: Secondary | ICD-10-CM

## 2021-12-06 DIAGNOSIS — R1013 Epigastric pain: Secondary | ICD-10-CM

## 2021-12-06 NOTE — Progress Notes (Signed)
°  HPI with pertinent ROS:   CC: transfer of care  HPI: Very pleasant 35 year old male to male transgender patient presenting today to transfer care to a new PCP.  Mood-has been treated chronically with Zoloft 100 mg daily as well as Xanax 0.5 mg twice daily with the extra dose periodically through the month on super stressful days.  This has been the same for over a decade and the doses have not changed.  Feels this keeps her mood fairly well stable although does still have some significant anxiety throughout the day.  Has trazodone to use for sleep but only uses this as needed and has not taken this in greater than 1 year.  Uses melatonin 1.5 to 5 mg nightly.  Hormone replacement-taking estradiol 4 mg once daily, tolerating well without side effects.  Last estrogen checked was significantly elevated but no dosage change was made.  Has been off of spironolactone for the last couple of weeks so expects testosterone to be a little elevated.  Has not been able to afford Provera for a while and felt this medication was not medically necessary.  Taking pantoprazole for reflux, tolerating well without side effects.  Feels it keeps her reflux well controlled.  I reviewed the past medical history, family history, social history, surgical history, and allergies today and no changes were needed.  Please see the problem list section below in epic for further details.   Physical exam:   General: Well Developed, well nourished, and in no acute distress.  Neuro: Alert and oriented x3, extra-ocular muscles intact, sensation grossly intact.  HEENT: Normocephalic, atraumatic, pupils equal round reactive to light, neck supple, no masses, no lymphadenopathy, thyroid nonpalpable.  Skin: Warm and dry, no rashes. Cardiac: Regular rate and rhythm, no murmurs rubs or gallops, no lower extremity edema.  Respiratory: Clear to auscultation bilaterally. Not using accessory muscles, speaking in full  sentences.  Impression and Recommendations:    1. Encounter to establish care Reviewed available information and discussed care concerns with patient.   2. 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  Rechecking estradiol and testosterone levels today.  Restart spironolactone.  Refilling estradiol 2 mg tablets but may need to titrate depending on estradiol results.  Explained that higher than recommended estrogen levels are associated with blood clots as well as breast cancer risk.  Patient verbalized understanding and is agreeable to the plan. - Estradiol - Testosterone  3. Dyspepsia Continue pantoprazole 20 mg daily.  4. Generalized anxiety disorder Continue Zoloft 100 mg daily.  Discussed use of Xanax and how this is not ideal for managing chronic anxiety.  Unfortunately, we have been on this for greater than a decade at the same dose and the likelihood of titration without significant resurgence of anxiety is low.  For now we will continue alprazolam 0.5 mg twice daily, okay to use an extra dose on super stressful days but no more than 3 doses per day.  Return in about 3 months (around 03/06/2022) for hormone replacement/mood follow up. ___________________________________________ Thayer Ohm, DNP, APRN, FNP-BC Primary Care and Sports Medicine [facility]

## 2021-12-07 LAB — TESTOSTERONE: Testosterone: 232 ng/dL — ABNORMAL LOW (ref 250–827)

## 2021-12-07 LAB — ESTRADIOL: Estradiol: 150 pg/mL — ABNORMAL HIGH (ref <=39)

## 2021-12-20 ENCOUNTER — Other Ambulatory Visit: Payer: Self-pay

## 2021-12-20 DIAGNOSIS — F411 Generalized anxiety disorder: Secondary | ICD-10-CM

## 2021-12-20 NOTE — Progress Notes (Unsigned)
Pt called and requested refill of xanax. Last refill was 11/14/21 Last OV with you 12/06/21

## 2021-12-21 MED ORDER — ALPRAZOLAM 0.5 MG PO TABS: ORAL_TABLET | ORAL | 0 refills | Status: DC

## 2022-01-15 ENCOUNTER — Encounter: Payer: Self-pay | Admitting: Medical-Surgical

## 2022-01-19 ENCOUNTER — Other Ambulatory Visit: Payer: Self-pay | Admitting: Medical-Surgical

## 2022-01-19 DIAGNOSIS — F411 Generalized anxiety disorder: Secondary | ICD-10-CM

## 2022-03-27 ENCOUNTER — Other Ambulatory Visit: Payer: Self-pay | Admitting: Medical-Surgical

## 2022-03-27 DIAGNOSIS — F411 Generalized anxiety disorder: Secondary | ICD-10-CM

## 2022-03-28 NOTE — Telephone Encounter (Signed)
Needs appointment for further refills. Overdue for follow up with no appointment scheduled at this time. Recommended q3 month follow up at our appointment in January.  ?

## 2022-05-01 ENCOUNTER — Other Ambulatory Visit: Payer: Self-pay | Admitting: Medical-Surgical

## 2022-05-01 ENCOUNTER — Other Ambulatory Visit: Payer: Self-pay | Admitting: Family Medicine

## 2022-05-01 ENCOUNTER — Other Ambulatory Visit: Payer: Self-pay

## 2022-05-01 DIAGNOSIS — F411 Generalized anxiety disorder: Secondary | ICD-10-CM

## 2022-05-01 MED ORDER — ALPRAZOLAM 0.5 MG PO TABS: ORAL_TABLET | ORAL | 0 refills | Status: DC

## 2022-05-01 NOTE — Telephone Encounter (Signed)
completed

## 2022-05-01 NOTE — Telephone Encounter (Signed)
Task completed. Patient has been updated that rx refill sent to the pharmacy. Patient has confirmed to keep their upcoming appt.

## 2022-05-01 NOTE — Telephone Encounter (Signed)
Pt called to schedule medication refill f/u. The soonest I could schedule was 07/13. Pt wants to know if refill could be called in to cover until appt. Pharmacy on file is correct

## 2022-05-17 ENCOUNTER — Other Ambulatory Visit: Payer: Self-pay | Admitting: Family Medicine

## 2022-05-17 DIAGNOSIS — F411 Generalized anxiety disorder: Secondary | ICD-10-CM

## 2022-05-18 ENCOUNTER — Encounter: Payer: Self-pay | Admitting: Medical-Surgical

## 2022-05-18 ENCOUNTER — Other Ambulatory Visit: Payer: Self-pay | Admitting: Family Medicine

## 2022-05-18 DIAGNOSIS — F411 Generalized anxiety disorder: Secondary | ICD-10-CM

## 2022-05-24 ENCOUNTER — Telehealth (INDEPENDENT_AMBULATORY_CARE_PROVIDER_SITE_OTHER): Payer: Self-pay | Admitting: Medical-Surgical

## 2022-05-24 ENCOUNTER — Encounter: Payer: Self-pay | Admitting: Medical-Surgical

## 2022-05-24 DIAGNOSIS — Z789 Other specified health status: Secondary | ICD-10-CM

## 2022-05-24 DIAGNOSIS — L309 Dermatitis, unspecified: Secondary | ICD-10-CM

## 2022-05-24 DIAGNOSIS — F411 Generalized anxiety disorder: Secondary | ICD-10-CM

## 2022-05-24 MED ORDER — SERTRALINE HCL 100 MG PO TABS: 100.0000 mg | ORAL_TABLET | Freq: Every day | ORAL | 3 refills | Status: DC

## 2022-05-24 MED ORDER — BETAMETHASONE DIPROPIONATE 0.05 % EX CREA: 1.0000 "application " | TOPICAL_CREAM | Freq: Two times a day (BID) | CUTANEOUS | 2 refills | Status: DC | PRN

## 2022-05-24 MED ORDER — ESTRADIOL 2 MG PO TABS: 4.0000 mg | ORAL_TABLET | Freq: Every day | ORAL | 3 refills | Status: DC

## 2022-05-24 MED ORDER — ALPRAZOLAM 0.5 MG PO TABS: ORAL_TABLET | ORAL | 2 refills | Status: DC

## 2022-05-24 NOTE — Progress Notes (Signed)
An attempt to call was made at 11:30 am left vm for pt to retun call to clinic

## 2022-05-24 NOTE — Progress Notes (Signed)
Virtual Visit via Video Note  I connected with Ethan Harris on 05/24/22 at  1:00 PM EDT by a video enabled telemedicine application and verified that I am speaking with the correct person using two identifiers.   I discussed the limitations of evaluation and management by telemedicine and the availability of in person appointments. The patient expressed understanding and agreed to proceed.  Patient location: home Provider locations: office  Subjective:    CC: Mood follow-up  HPI: Pleasant 36 year old transgender male presenting today for follow-up on mood.  Has been taking Zoloft 100 mg daily, tolerating well without side effects.  Has been on this long-term and feels this works well to keep her mood stable.  Has also been treated long-term with alprazolam 0.5 mg 3 times daily as needed.  Has been on this medication for at least a decade and typically uses it twice daily with a third half dose or full dose occasionally on very stressful days.  Denies SI/HI.  Requesting refills on both these medications.  Has a history of eczema and was treating it previously with betamethasone cream.  Has been try to get some sun exposure recently and now has developed a bit of eczema again.  Requesting a refill.  Past medical history, Surgical history, Family history not pertinant except as noted below, Social history, Allergies, and medications have been entered into the medical record, reviewed, and corrections made.   Review of Systems: See HPI for pertinent positives and negatives.   Objective:    General: Speaking clearly in complete sentences without any shortness of breath.  Alert and oriented x3.  Normal judgment. No apparent acute distress.  Impression and Recommendations:    1. Generalized anxiety disorder Symptoms well managed.  Continue Zoloft 100 mg daily as prescribed.  Refill sent.  Discussed use of benzodiazepines for the shortest period of time at the lowest dose available.  Recommend  limiting dosing to no more than twice daily but if needed, okay for the third dose.  If taking the third dose, would recommend taking one half of a tablet rather than the full thing and if that is not helpful then go for the second half of the tablet.  Refills sent to pharmacy as requested. - sertraline (ZOLOFT) 100 MG tablet; Take 1 tablet (100 mg total) by mouth daily.  Dispense: 90 tablet; Refill: 3 - ALPRAZolam (XANAX) 0.5 MG tablet; TAKE ONE TABLET BY MOUTH THREE TIMES A DAY AS NEEDED FOR ANXIETY  Dispense: 90 tablet; Refill: 2  2. Eczema, unspecified type Refilling betamethasone cream for as needed use.  3.  Transgender male Due for labs for hormone therapy.  Taking estradiol 4 mg daily.  Restarted spironolactone after testosterone came back elevated in January.  Lab orders entered for her to come by the lab and have those drawn.  Reports that she will come by with her significant other at their appointment next month  I discussed the assessment and treatment plan with the patient. The patient was provided an opportunity to ask questions and all were answered. The patient agreed with the plan and demonstrated an understanding of the instructions.   The patient was advised to call back or seek an in-person evaluation if the symptoms worsen or if the condition fails to improve as anticipated.  25 minutes of non-face-to-face time was provided during this encounter.  Return in about 6 months (around 11/24/2022) for mood/HRT follow up.  Thayer Ohm, DNP, APRN, FNP-BC [facility] Primary  Care and Sports Medicine

## 2022-06-07 ENCOUNTER — Other Ambulatory Visit: Payer: Self-pay | Admitting: Osteopathic Medicine

## 2022-06-07 DIAGNOSIS — Z789 Other specified health status: Secondary | ICD-10-CM

## 2022-06-22 ENCOUNTER — Other Ambulatory Visit: Payer: Self-pay | Admitting: Osteopathic Medicine

## 2022-07-14 ENCOUNTER — Other Ambulatory Visit: Payer: Self-pay | Admitting: Osteopathic Medicine

## 2022-07-14 DIAGNOSIS — R1013 Epigastric pain: Secondary | ICD-10-CM

## 2022-08-16 ENCOUNTER — Other Ambulatory Visit: Payer: Self-pay | Admitting: Osteopathic Medicine

## 2022-08-16 DIAGNOSIS — Z789 Other specified health status: Secondary | ICD-10-CM

## 2022-10-02 ENCOUNTER — Telehealth: Payer: Self-pay

## 2022-10-02 DIAGNOSIS — F411 Generalized anxiety disorder: Secondary | ICD-10-CM

## 2022-10-02 MED ORDER — ALPRAZOLAM 0.5 MG PO TABS: ORAL_TABLET | ORAL | 1 refills | Status: DC

## 2022-10-02 NOTE — Telephone Encounter (Signed)
Pt called for a refill of her xanax. She is due for a  mood/HRT follow up in January.

## 2022-11-20 ENCOUNTER — Other Ambulatory Visit: Payer: Self-pay

## 2022-11-20 DIAGNOSIS — Z789 Other specified health status: Secondary | ICD-10-CM

## 2022-11-20 MED ORDER — ESTRADIOL 2 MG PO TABS: 2.0000 mg | ORAL_TABLET | Freq: Two times a day (BID) | ORAL | 0 refills | Status: DC

## 2022-12-13 ENCOUNTER — Encounter: Payer: Self-pay | Admitting: Medical-Surgical

## 2022-12-13 ENCOUNTER — Ambulatory Visit (INDEPENDENT_AMBULATORY_CARE_PROVIDER_SITE_OTHER): Payer: Self-pay | Admitting: Medical-Surgical

## 2022-12-13 VITALS — BP 129/61 | HR 79 | Resp 20 | Ht 66.0 in | Wt 185.4 lb

## 2022-12-13 DIAGNOSIS — Z789 Other specified health status: Secondary | ICD-10-CM | POA: Insufficient documentation

## 2022-12-13 DIAGNOSIS — R1013 Epigastric pain: Secondary | ICD-10-CM | POA: Insufficient documentation

## 2022-12-13 DIAGNOSIS — F411 Generalized anxiety disorder: Secondary | ICD-10-CM

## 2022-12-13 DIAGNOSIS — E785 Hyperlipidemia, unspecified: Secondary | ICD-10-CM

## 2022-12-13 MED ORDER — SERTRALINE HCL 100 MG PO TABS: 100.0000 mg | ORAL_TABLET | Freq: Every day | ORAL | 3 refills | Status: DC

## 2022-12-13 MED ORDER — ESTRADIOL 2 MG PO TABS: 2.0000 mg | ORAL_TABLET | Freq: Two times a day (BID) | ORAL | 1 refills | Status: DC

## 2022-12-13 MED ORDER — ALPRAZOLAM 0.5 MG PO TABS: ORAL_TABLET | ORAL | 1 refills | Status: DC

## 2022-12-13 MED ORDER — SPIRONOLACTONE 100 MG PO TABS: 100.0000 mg | ORAL_TABLET | Freq: Every day | ORAL | 1 refills | Status: DC

## 2022-12-13 NOTE — Progress Notes (Signed)
Established Patient Office Visit  Subjective   Patient ID: Ethan Harris, transgender male   DOB: 05-Jan-1986 Age: 37 y.o. MRN: [id number]   Chief Complaint  Patient presents with   Follow-up   Medication Refill   HPI Pleasant 37 year old transgender male presenting today for the following:  HRT: Has been taking spironolactone 100 mg daily, tolerating well without side effects but unfortunately ran out of this about a week ago.  Previously taking estradiol 2 mg twice daily but when she ran out of spironolactone, she increased the estradiol to 2 mg 3 times daily.  Currently happy with the results of the medications and the physical changes this has brought about.  Mood: Taking sertraline 100 mg daily, tolerating well without side effects.  Continues to use Xanax 0.5 mg twice daily on most days but sometimes increases it to 3 times daily as prescribed.  Has been on benzodiazepines for greater than 10 years.  Denies SI/HI.  GERD: Previously prescribed Protonix 20-40 mg daily as needed with instructions to use the smallest amount effective for the least amount of time.  Lately has had no issues with reflux so has completely come off the medication and is doing fine.   Objective:    Vitals:   12/13/22 1453  BP: 129/61  Pulse: 79  Resp: 20  Height: 5\' 6"  (1.676 m)  Weight: 185 lb 6.4 oz (84.1 kg)  SpO2: 97%  BMI (Calculated): 29.94    Physical Exam Vitals and nursing note reviewed.  Constitutional:      General: She is not in acute distress.    Appearance: Normal appearance. She is not ill-appearing.  HENT:     Head: Normocephalic and atraumatic.  Cardiovascular:     Rate and Rhythm: Normal rate and regular rhythm.     Pulses: Normal pulses.     Heart sounds: Normal heart sounds.  Pulmonary:     Effort: Pulmonary effort is normal. No respiratory distress.     Breath sounds: Normal breath sounds. No wheezing, rhonchi or rales.  Skin:    General: Skin is warm and dry.   Neurological:     Mental Status: She is alert and oriented to person, place, and time.  Psychiatric:        Mood and Affect: Mood normal.        Behavior: Behavior normal.        Thought Content: Thought content normal.        Judgment: Judgment normal.   No results found for this or any previous visit (from the past 24 hour(s)).     The ASCVD Risk score (Arnett DK, et al., 2019) failed to calculate for the following reasons:   The 2019 ASCVD risk score is only valid for ages 74 to 10   Assessment & Plan:   1. Dyslipidemia Checking labs as below. - Lipid panel - COMPLETE METABOLIC PANEL WITH GFR  2. Generalized anxiety disorder Discussed symptoms.  Notable for benzodiazepine dependence.  Continue sertraline 100 mg daily.  Recommend using Xanax the least amount possible and only for severe anxiety that is not resolved with other measures. - ALPRAZolam (XANAX) 0.5 MG tablet; TAKE ONE TABLET BY MOUTH THREE TIMES A DAY AS NEEDED FOR ANXIETY  Dispense: 90 tablet; Refill: 1 - sertraline (ZOLOFT) 100 MG tablet; Take 1 tablet (100 mg total) by mouth daily.  Dispense: 90 tablet; Refill: 3  3. Male-to-male transgender person Checking labs as below.  Resume spironolactone 100 mg daily.  Reduce estradiol to 2 mg twice daily with the resumption of spironolactone. - CBC - Testosterone - Estradiol - estradiol (ESTRACE) 2 MG tablet; Take 1 tablet (2 mg total) by mouth 2 (two) times daily.  Dispense: 180 tablet; Refill: 1 - spironolactone (ALDACTONE) 100 MG tablet; Take 1 tablet (100 mg total) by mouth daily.  Dispense: 180 tablet; Refill: 1  4. Dyspepsia Okay to use Protonix on an as-needed basis.  Return in about 6 months (around 06/13/2023) for transgender care follow up.  ___________________________________________ Clearnce Sorrel, DNP, APRN, FNP-BC Primary Care and [place]

## 2022-12-14 LAB — CBC
HCT: 39.8 % (ref 38.5–50.0)
Hemoglobin: 13.7 g/dL (ref 13.2–17.1)
MCH: 28.4 pg (ref 27.0–33.0)
MCHC: 34.4 g/dL (ref 32.0–36.0)
MCV: 82.4 fL (ref 80.0–100.0)
MPV: 11.3 fL (ref 7.5–12.5)
Platelets: 266 10*3/uL (ref 140–400)
RBC: 4.83 10*6/uL (ref 4.20–5.80)
RDW: 13 % (ref 11.0–15.0)
WBC: 7.8 10*3/uL (ref 3.8–10.8)

## 2022-12-14 LAB — COMPLETE METABOLIC PANEL WITH GFR
AG Ratio: 1.6 (calc) (ref 1.0–2.5)
ALT: 14 U/L (ref 9–46)
AST: 13 U/L (ref 10–40)
Albumin: 4.5 g/dL (ref 3.6–5.1)
Alkaline phosphatase (APISO): 50 U/L (ref 36–130)
BUN: 14 mg/dL (ref 7–25)
CO2: 26 mmol/L (ref 20–32)
Calcium: 9.5 mg/dL (ref 8.6–10.3)
Chloride: 102 mmol/L (ref 98–110)
Creat: 0.62 mg/dL (ref 0.60–1.26)
Globulin: 2.9 g/dL (calc) (ref 1.9–3.7)
Glucose, Bld: 79 mg/dL (ref 65–99)
Potassium: 4.2 mmol/L (ref 3.5–5.3)
Sodium: 138 mmol/L (ref 135–146)
Total Bilirubin: 0.5 mg/dL (ref 0.2–1.2)
Total Protein: 7.4 g/dL (ref 6.1–8.1)
eGFR: 126 mL/min/{1.73_m2} (ref >=60)

## 2022-12-14 LAB — LIPID PANEL
Cholesterol: 251 mg/dL — ABNORMAL HIGH (ref <200)
HDL: 51 mg/dL (ref >=40)
LDL Cholesterol (Calc): 161 mg/dL (calc) — ABNORMAL HIGH
Non-HDL Cholesterol (Calc): 200 mg/dL (calc) — ABNORMAL HIGH (ref <130)
Total CHOL/HDL Ratio: 4.9 (calc) (ref <5.0)
Triglycerides: 219 mg/dL — ABNORMAL HIGH (ref <150)

## 2022-12-14 LAB — TESTOSTERONE: Testosterone: 270 ng/dL (ref 250–827)

## 2022-12-14 LAB — ESTRADIOL: Estradiol: 129 pg/mL — ABNORMAL HIGH (ref <=39)

## 2022-12-19 ENCOUNTER — Telehealth: Payer: Self-pay | Admitting: Physician Assistant

## 2022-12-19 DIAGNOSIS — N390 Urinary tract infection, site not specified: Secondary | ICD-10-CM

## 2022-12-20 NOTE — Progress Notes (Signed)
Because these types of infections are not common in transgender females and can be a more complicated infection, warranting a need for urine culture to make sure proper treatments are given , I feel your condition warrants further evaluation and I recommend that you be seen in a face to face visit.   NOTE: There will be NO CHARGE for this eVisit   If you are having a true medical emergency please call 911.      For an urgent face to face visit, [place] has eight urgent care centers for your convenience:   NEW!! [facility] [place] [facility] [place] Get Driving Directions [phone] [address] [place], [place] [facility] [place] [facility] [place] Get Driving Directions [phone] [place], [place] [address]   [place] [facility] [place] [facility] [place] [facility]) Get Driving Directions [phone] [address], [place] [address]  [place] [facility] [place] ([place]) Get Driving Directions [phone] [address] [place],  [place]  [address]  [facility] [place] [facility] [place] [facility] at [address] [place] Get Driving Directions  [phone] [address] [place],  [place] [address]   [facility] Get Driving Directions [phone] [address], [place], [place] [address]   [facility] Get Driving Directions  [phone] [address] [place], [place] [address]   [facility] [place] Get Driving Directions [phone] [address] [place], [place] [address]  Your MyChart E-visit questionnaire answers were reviewed by a board certified advanced clinical practitioner to complete your personal care plan based on your specific symptoms.  Thank you for using e-Visits.

## 2022-12-30 ENCOUNTER — Emergency Department (HOSPITAL_BASED_OUTPATIENT_CLINIC_OR_DEPARTMENT_OTHER)
Admission: EM | Admit: 2022-12-30 | Discharge: 2022-12-30 | Disposition: A | Discharge status: Home / Self Care | Payer: Self-pay | Attending: Emergency Medicine | Admitting: Emergency Medicine

## 2022-12-30 ENCOUNTER — Other Ambulatory Visit: Payer: Self-pay

## 2022-12-30 ENCOUNTER — Encounter (HOSPITAL_BASED_OUTPATIENT_CLINIC_OR_DEPARTMENT_OTHER): Payer: Self-pay

## 2022-12-30 ENCOUNTER — Telehealth (HOSPITAL_BASED_OUTPATIENT_CLINIC_OR_DEPARTMENT_OTHER): Payer: Self-pay | Admitting: Emergency Medicine

## 2022-12-30 ENCOUNTER — Emergency Department (HOSPITAL_BASED_OUTPATIENT_CLINIC_OR_DEPARTMENT_OTHER): Payer: Self-pay

## 2022-12-30 DIAGNOSIS — Z87891 Personal history of nicotine dependence: Secondary | ICD-10-CM | POA: Insufficient documentation

## 2022-12-30 DIAGNOSIS — N201 Calculus of ureter: Secondary | ICD-10-CM | POA: Insufficient documentation

## 2022-12-30 DIAGNOSIS — J45909 Unspecified asthma, uncomplicated: Secondary | ICD-10-CM | POA: Insufficient documentation

## 2022-12-30 HISTORY — DX: Unspecified asthma, uncomplicated: J45.909

## 2022-12-30 LAB — URINALYSIS, MICROSCOPIC (REFLEX)

## 2022-12-30 LAB — BASIC METABOLIC PANEL
Anion gap: 9 (ref 5–15)
BUN: 16 mg/dL (ref 6–20)
CO2: 22 mmol/L (ref 22–32)
Calcium: 8.9 mg/dL (ref 8.9–10.3)
Chloride: 103 mmol/L (ref 98–111)
Creatinine, Ser: 0.81 mg/dL (ref 0.61–1.24)
GFR, Estimated: >60 mL/min (ref >60)
Glucose, Bld: 121 mg/dL — ABNORMAL HIGH (ref 70–99)
Potassium: 3.7 mmol/L (ref 3.5–5.1)
Sodium: 134 mmol/L — ABNORMAL LOW (ref 135–145)

## 2022-12-30 LAB — CBC WITH DIFFERENTIAL/PLATELET
Abs Immature Granulocytes: 0.04 10*3/uL (ref 0.00–0.07)
Basophils Absolute: 0.1 10*3/uL (ref 0.0–0.1)
Basophils Relative: 1 %
Eosinophils Absolute: 0.1 10*3/uL (ref 0.0–0.5)
Eosinophils Relative: 1 %
HCT: 40.8 % (ref 39.0–52.0)
Hemoglobin: 13.7 g/dL (ref 13.0–17.0)
Immature Granulocytes: 0 %
Lymphocytes Relative: 27 %
Lymphs Abs: 3 10*3/uL (ref 0.7–4.0)
MCH: 27.9 pg (ref 26.0–34.0)
MCHC: 33.6 g/dL (ref 30.0–36.0)
MCV: 83.1 fL (ref 80.0–100.0)
Monocytes Absolute: 0.8 10*3/uL (ref 0.1–1.0)
Monocytes Relative: 7 %
Neutro Abs: 7.1 10*3/uL (ref 1.7–7.7)
Neutrophils Relative %: 64 %
Platelets: 308 10*3/uL (ref 150–400)
RBC: 4.91 MIL/uL (ref 4.22–5.81)
RDW: 13.2 % (ref 11.5–15.5)
WBC: 11.2 10*3/uL — ABNORMAL HIGH (ref 4.0–10.5)
nRBC: 0 % (ref 0.0–0.2)

## 2022-12-30 LAB — URINALYSIS, ROUTINE W REFLEX MICROSCOPIC
Bilirubin Urine: NEGATIVE
Glucose, UA: NEGATIVE mg/dL
Ketones, ur: NEGATIVE mg/dL
Leukocytes,Ua: NEGATIVE
Nitrite: POSITIVE — AB
Protein, ur: NEGATIVE mg/dL
Specific Gravity, Urine: 1.025 (ref 1.005–1.030)
pH: 6.5 (ref 5.0–8.0)

## 2022-12-30 MED ORDER — HYDROMORPHONE HCL 2 MG PO TABS: 2.0000 mg | ORAL_TABLET | ORAL | 0 refills | Status: DC | PRN

## 2022-12-30 MED ORDER — TAMSULOSIN HCL 0.4 MG PO CAPS
0.4000 mg | ORAL_CAPSULE | Freq: Once | ORAL | Status: AC
  Administered 2022-12-30: 0.4 mg via ORAL
  Filled 2022-12-30: qty 1

## 2022-12-30 MED ORDER — SODIUM CHLORIDE 0.9 % IV BOLUS
1000.0000 mL | Freq: Once | INTRAVENOUS | Status: AC
  Administered 2022-12-30: 1000 mL via INTRAVENOUS

## 2022-12-30 MED ORDER — HYDROMORPHONE HCL 1 MG/ML IJ SOLN
1.0000 mg | Freq: Once | INTRAMUSCULAR | Status: AC
  Administered 2022-12-30: 1 mg via INTRAVENOUS
  Filled 2022-12-30: qty 1

## 2022-12-30 MED ORDER — TAMSULOSIN HCL 0.4 MG PO CAPS: ORAL_CAPSULE | ORAL | 0 refills | Status: DC

## 2022-12-30 MED ORDER — ONDANSETRON HCL 4 MG/2ML IJ SOLN
4.0000 mg | Freq: Once | INTRAMUSCULAR | Status: AC
  Administered 2022-12-30: 4 mg via INTRAVENOUS
  Filled 2022-12-30: qty 2

## 2022-12-30 MED ORDER — ONDANSETRON 8 MG PO TBDP: 8.0000 mg | ORAL_TABLET | Freq: Three times a day (TID) | ORAL | 1 refills | Status: DC | PRN

## 2022-12-30 NOTE — ED Notes (Signed)
Pt resting quietly in stretcher, respirations even, unlabored, no acute distress noted.

## 2022-12-30 NOTE — Telephone Encounter (Signed)
[facility] didn't have that prescription, will switch prescription to Ethan Harris at [facility]

## 2022-12-30 NOTE — ED Notes (Signed)
Provided pt with strainer for urine.

## 2022-12-30 NOTE — ED Provider Notes (Signed)
[facility] [facility] Provider Note: Georgena Spurling, MD, FACEP  CSN: [id number] MRN: [id number] ARRIVAL: 12/30/22 at [place]  Abdominal Pain   HISTORY OF PRESENT ILLNESS  12/30/22 12:49 AM Ethan Harris is a 37 y.o. adult with suprapubic pain that began several hours prior to arrival.  The pain is a 9 out of 10 and similar to previous kidney stone pain although there is no associated flank pain.  The patient is having difficulty urinating but has not noted blood in the urine or burning with urination.  The pain is described as cramping and sharp.  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    male to male- has used spironialtone, estrogen, premarin on-off   Hypercholesteremia    HYPERLIPIDEMIA 11/16/2010   Insomnia    Urolithiasis 07/22/2013    Past Surgical History:  Procedure Laterality Date   INJECTION OF SILICONE OIL  [id number]   lips, cheeks, buttock and thighs   NO PAST SURGERIES      Family History  Problem Relation Age of Onset   Depression Mother    Hypertension Maternal Grandmother    Stroke Maternal Grandmother    Skin cancer Maternal Grandmother    Drug abuse Maternal Grandmother    Drug abuse Paternal Grandmother    Colon cancer Neg Hx    Prostate cancer Neg Hx    Diabetes Neg Hx    Heart attack Neg Hx     Social History   Tobacco Use   Smoking status: Former    Packs/day: 0.50    Years: 8.00    Total pack years: 4.00    Types: Cigarettes, E-cigarettes    Quit date: 04/13/2011    Years since quitting: 11.7   Smokeless tobacco: Never  Vaping Use   Vaping Use: Some days  Substance Use Topics   Alcohol use: Yes    Alcohol/week: 2.0 - 3.0 standard drinks of alcohol    Types: 2 - 3 Standard drinks or equivalent per week    Comment:  Socially   Drug use: Not Currently    Types: Cocaine, Marijuana    Comment: >10 years ago    Prior to Admission medications   Medication Sig Start Date End Date Taking? Authorizing Provider  HYDROmorphone (DILAUDID) 2 MG tablet Take 1 tablet (2 mg total) by mouth every 4 (four) hours as needed for severe pain. 12/30/22  Yes Orrie Schubert, Jenny Reichmann, MD  ondansetron (ZOFRAN-ODT) 8 MG disintegrating tablet Take 1 tablet (8 mg total) by mouth every 8 (eight) hours as needed for nausea or vomiting. 12/30/22  Yes Dezra Mandella, MD  tamsulosin (FLOMAX) 0.4 MG CAPS capsule Take 1 capsule daily until stone passes. 12/30/22  Yes Arsenia Goracke, MD  albuterol (VENTOLIN HFA) 108 (90 Base) MCG/ACT inhaler INHALE TWO PUFFS BY MOUTH INTO THE LUNGS EVERY 6 HOURS AS NEEDED FOR WHEEZING OR FOR SHORTNESS OF BREATH 12/07/19   Emeterio Reeve, DO  ALPRAZolam Duanne Moron) 0.5 MG tablet TAKE ONE TABLET BY MOUTH THREE TIMES A DAY AS NEEDED FOR ANXIETY 12/13/22   Samuel Bouche, NP  betamethasone dipropionate 0.05 % cream Apply 1 application  topically 2 (two) times daily as needed. For eczema 05/24/22   Samuel Bouche, NP  estradiol (ESTRACE) 2  MG tablet Take 1 tablet (2 mg total) by mouth 2 (two) times daily. 12/13/22   Samuel Bouche, NP  loratadine (CLARITIN) 10 MG tablet Take 10 mg by mouth daily.    [provider]  MELATONIN PO Take 1-1.5 mg by mouth.    [provider]  pantoprazole (PROTONIX) 20 MG tablet TAKE 1 TO 2 TABLETS BY MOUTH DAILY AS NEEDED **TAKE LOWEST EFFECTIVE DOSE** 08/02/22   Samuel Bouche, NP  sertraline (ZOLOFT) 100 MG tablet Take 1 tablet (100 mg total) by mouth daily. 12/13/22   Samuel Bouche, NP  spironolactone (ALDACTONE) 100 MG tablet Take 1 tablet (100 mg total) by mouth daily. 12/13/22   Samuel Bouche, NP    Allergies Kiwi extract and Other   REVIEW OF SYSTEMS  Negative except as noted here or in the History of Present Illness.   PHYSICAL EXAMINATION  Initial Vital Signs Blood pressure 117/71, pulse 78,  temperature 98 F (36.7 C), temperature source Oral, resp. rate 18, height 5' 6"$  (1.676 m), weight 83.9 kg, SpO2 99 %.  Examination General: Well-developed, well-nourished adult in no acute distress; appearance consistent with age of record HENT: normocephalic; atraumatic Eyes: Normal appearance Neck: supple Heart: regular rate and rhythm Lungs: clear to auscultation bilaterally Abdomen: soft; nondistended; suprapubic tenderness; bowel sounds present Extremities: No deformity; full range of motion; pulses normal Neurologic: Awake, alert and oriented; motor function intact in all extremities and symmetric; no facial droop Skin: Warm and dry Psychiatric: Normal mood and affect   RESULTS  Summary of this visit's results, reviewed and interpreted by myself:   EKG Interpretation  Date/Time:    Ventricular Rate:    PR Interval:    QRS Duration:   QT Interval:    QTC Calculation:   R Axis:     Text Interpretation:         Laboratory Studies: Results for orders placed or performed during the hospital encounter of 12/30/22 (from the past 24 hour(s))  CBC with Differential     Status: Abnormal   Collection Time: 12/30/22  1:02 AM  Result Value Ref Range   WBC 11.2 (H) 4.0 - 10.5 K/uL   RBC 4.91 4.22 - 5.81 MIL/uL   Hemoglobin 13.7 13.0 - 17.0 g/dL   HCT 40.8 39.0 - 52.0 %   MCV 83.1 80.0 - 100.0 fL   MCH 27.9 26.0 - 34.0 pg   MCHC 33.6 30.0 - 36.0 g/dL   RDW 13.2 11.5 - 15.5 %   Platelets 308 150 - 400 K/uL   nRBC 0.0 0.0 - 0.2 %   Neutrophils Relative % 64 %   Neutro Abs 7.1 1.7 - 7.7 K/uL   Lymphocytes Relative 27 %   Lymphs Abs 3.0 0.7 - 4.0 K/uL   Monocytes Relative 7 %   Monocytes Absolute 0.8 0.1 - 1.0 K/uL   Eosinophils Relative 1 %   Eosinophils Absolute 0.1 0.0 - 0.5 K/uL   Basophils Relative 1 %   Basophils Absolute 0.1 0.0 - 0.1 K/uL   Immature Granulocytes 0 %   Abs Immature Granulocytes 0.04 0.00 - 0.07 K/uL  Basic metabolic panel     Status: Abnormal    Collection Time: 12/30/22  1:02 AM  Result Value Ref Range   Sodium 134 (L) 135 - 145 mmol/L   Potassium 3.7 3.5 - 5.1 mmol/L   Chloride 103 98 - 111 mmol/L   CO2 22 22 - 32 mmol/L   Glucose, Bld 121 (H) 70 -  99 mg/dL   BUN 16 6 - 20 mg/dL   Creatinine, Ser 0.81 0.61 - 1.24 mg/dL   Calcium 8.9 8.9 - 10.3 mg/dL   GFR, Estimated >60 >60 mL/min   Anion gap 9 5 - 15  Urinalysis, Routine w reflex microscopic -Urine, Clean Catch     Status: Abnormal   Collection Time: 12/30/22  1:54 AM  Result Value Ref Range   Color, Urine AMBER (A) YELLOW   APPearance CLEAR CLEAR   Specific Gravity, Urine 1.025 1.005 - 1.030   pH 6.5 5.0 - 8.0   Glucose, UA NEGATIVE NEGATIVE mg/dL   Hgb urine dipstick MODERATE (A) NEGATIVE   Bilirubin Urine NEGATIVE NEGATIVE   Ketones, ur NEGATIVE NEGATIVE mg/dL   Protein, ur NEGATIVE NEGATIVE mg/dL   Nitrite POSITIVE (A) NEGATIVE   Leukocytes,Ua NEGATIVE NEGATIVE  Urinalysis, Microscopic (reflex)     Status: Abnormal   Collection Time: 12/30/22  1:54 AM  Result Value Ref Range   RBC / HPF 21-50 0 - 5 RBC/hpf   WBC, UA 0-5 0 - 5 WBC/hpf   Bacteria, UA FEW (A) NONE SEEN   Squamous Epithelial / HPF 0-5 0 - 5 /HPF   Mucus PRESENT    Hyaline Casts, UA PRESENT    Imaging Studies: CT Renal Stone Study  Result Date: 12/30/2022 CLINICAL DATA:  Flank pain and vomiting EXAM: CT ABDOMEN AND PELVIS WITHOUT CONTRAST TECHNIQUE: Multidetector CT imaging of the abdomen and pelvis was performed following the standard protocol without IV contrast. RADIATION DOSE REDUCTION: This exam was performed according to the departmental dose-optimization program which includes automated exposure control, adjustment of the mA and/or kV according to patient size and/or use of iterative reconstruction technique. COMPARISON:  07/01/2012 FINDINGS: Lower chest: No acute abnormality. Hepatobiliary: No focal liver abnormality is seen. No gallstones, gallbladder wall thickening, or biliary  dilatation. Pancreas: Unremarkable. No pancreatic ductal dilatation or surrounding inflammatory changes. Spleen: Normal in size without focal abnormality. Adrenals/Urinary Tract: Adrenal glands are within normal limits. Right kidney is well visualized with punctate nonobstructing stone identified. Left kidney demonstrates mild hydronephrosis as well as a punctate nonobstructing renal stone. The left-sided hydronephrosis extends inferiorly to the ureterovesical junction where a 7-8 mm stone is identified causing obstructive change. The bladder is decompressed. Stomach/Bowel: Colon is decompressed. No obstructive or inflammatory changes are noted. The appendix is within normal limits. Small bowel and stomach are unremarkable. Vascular/Lymphatic: No significant vascular findings are present. No enlarged abdominal or pelvic lymph nodes. Reproductive: Prostate is unremarkable. Other: No abdominal wall hernia or abnormality. No abdominopelvic ascites. Musculoskeletal: No acute bony abnormality is noted. Stable nodularity in the subcutaneous fat over the gluteal muscles bilaterally. This is stable from the prior exam and benign in etiology. IMPRESSION: 7-8 mm left UVJ stone with obstructive change. Punctate nonobstructing renal stones are noted bilaterally. Electronically Signed   By: Inez Catalina M.D.   On: 12/30/2022 01:14    ED COURSE and MDM  Nursing notes, initial and subsequent vitals signs, including pulse oximetry, reviewed and interpreted by myself.  Vitals:   12/30/22 0045 12/30/22 0115 12/30/22 0130 12/30/22 0200  BP: 117/71 111/72 (!) 100/59 (!) 121/90  Pulse: 78 77 73 66  Resp: 18     Temp: 98 F (36.7 C)     TempSrc: Oral     SpO2: 99% 98% 97% 100%  Weight:      Height:       Medications  HYDROmorphone (DILAUDID) injection 1 mg (has no  administration in time range)  sodium chloride 0.9 % bolus 1,000 mL (0 mLs Intravenous Stopped 12/30/22 0220)  ondansetron (ZOFRAN) injection 4 mg (4 mg  Intravenous Given 12/30/22 0113)  HYDROmorphone (DILAUDID) injection 1 mg (1 mg Intravenous Given 12/30/22 0113)  tamsulosin (FLOMAX) capsule 0.4 mg (0.4 mg Oral Given 12/30/22 0135)   1:31 AM CT scan shows a large left UVJ stone.  Still awaiting a urine specimen from the patient.  The patient's pain is currently about a 5 out of 10 and additional pain medication is declined at this time.   2:41 AM The patient states that with passed large stones they have usually broken up on their own and passed smaller stones.  We will refer to urology should this not occur and the patient needs urologic intervention.   PROCEDURES  Procedures   ED DIAGNOSES     ICD-10-CM   1. Ureterolithiasis  N20.1          Yanely Mast, MD 12/30/22 [phone]

## 2022-12-30 NOTE — ED Triage Notes (Signed)
Patient reports lower abdominal pain onset a few hours PTA with associated nausea and vomiting. States she believes this is a kidney stones, hx of same approx 10 years ago.

## 2022-12-30 NOTE — ED Notes (Signed)
Pt ambulatory to restroom with steady gait.

## 2022-12-31 ENCOUNTER — Telehealth: Payer: Self-pay | Admitting: General Practice

## 2022-12-31 NOTE — Telephone Encounter (Signed)
Patient returned call. She doesn't think that she needs a hospital follow up. She had a kidney stone and has pretty much recovered from that.

## 2022-12-31 NOTE — Transitions of Care (Post Inpatient/ED Visit) (Signed)
   12/31/2022  Name: Ethan Harris MRN: [id number] DOB: 1986-02-16  Today's TOC FU Call Status: Today's TOC FU Call Status:: Unsuccessul Call (1st Attempt) Unsuccessful Call (1st Attempt) Date: 12/31/22  Attempted to reach the patient regarding the most recent Inpatient/ED visit.  Follow Up Plan: Additional outreach attempts will be made to reach the patient to complete the Transitions of Care (Post Inpatient/ED visit) call.   Signature Tinnie Gens, RN BSN

## 2023-01-02 ENCOUNTER — Encounter: Payer: Self-pay | Admitting: Medical-Surgical

## 2023-01-03 NOTE — Transitions of Care (Post Inpatient/ED Visit) (Signed)
   01/03/2023  Name: Ethan Harris MRN: [id number] DOB: 18-May-1986  Today's TOC FU Call Status: Today's TOC FU Call Status:: Unsuccessful Call (2nd Attempt) Unsuccessful Call (1st Attempt) Date: 12/31/22 Unsuccessful Call (2nd Attempt) Date: 01/03/23  Attempted to reach the patient regarding the most recent Inpatient/ED visit.  Follow Up Plan: Additional outreach attempts will be made to reach the patient to complete the Transitions of Care (Post Inpatient/ED visit) call.   Signature Tinnie Gens, Therapist, sports, BSN

## 2023-01-04 NOTE — Transitions of Care (Post Inpatient/ED Visit) (Signed)
   01/04/2023  Name: Ethan Harris MRN: [id number] DOB: Mar 23, 1986  Today's TOC FU Call Status: Today's TOC FU Call Status:: Unsuccessful Call (3rd Attempt) Unsuccessful Call (1st Attempt) Date: 12/31/22 Unsuccessful Call (2nd Attempt) Date: 01/03/23 Unsuccessful Call (3rd Attempt) Date: 01/04/23  Attempted to reach the patient regarding the most recent Inpatient/ED visit.  Follow Up Plan: No further outreach attempts will be made at this time. We have been unable to contact the patient.  Signature Tinnie Gens, RN BSN

## 2023-03-05 ENCOUNTER — Other Ambulatory Visit: Payer: Self-pay

## 2023-03-05 DIAGNOSIS — F411 Generalized anxiety disorder: Secondary | ICD-10-CM

## 2023-03-05 MED ORDER — ALPRAZOLAM 0.5 MG PO TABS: ORAL_TABLET | ORAL | 1 refills | Status: DC

## 2023-05-20 ENCOUNTER — Telehealth: Payer: Self-pay | Admitting: Medical-Surgical

## 2023-05-20 NOTE — Telephone Encounter (Signed)
Pt called.  He is requesting a refill on his Alprazolam.

## 2023-05-21 NOTE — Telephone Encounter (Signed)
Appointment 05/23/2023

## 2023-05-23 ENCOUNTER — Encounter: Payer: Self-pay | Admitting: Medical-Surgical

## 2023-05-23 ENCOUNTER — Ambulatory Visit (INDEPENDENT_AMBULATORY_CARE_PROVIDER_SITE_OTHER): Payer: Self-pay | Admitting: Medical-Surgical

## 2023-05-23 VITALS — BP 133/80 | HR 97 | Temp 97.8°F | Ht 66.0 in | Wt 184.6 lb

## 2023-05-23 DIAGNOSIS — F411 Generalized anxiety disorder: Secondary | ICD-10-CM

## 2023-05-23 DIAGNOSIS — Z789 Other specified health status: Secondary | ICD-10-CM

## 2023-05-23 DIAGNOSIS — D171 Benign lipomatous neoplasm of skin and subcutaneous tissue of trunk: Secondary | ICD-10-CM

## 2023-05-23 MED ORDER — ALPRAZOLAM 0.5 MG PO TABS: ORAL_TABLET | ORAL | 1 refills | Status: DC

## 2023-05-23 MED ORDER — ESTRADIOL 2 MG PO TABS: 2.0000 mg | ORAL_TABLET | Freq: Two times a day (BID) | ORAL | 1 refills | Status: DC

## 2023-05-23 MED ORDER — SPIRONOLACTONE 100 MG PO TABS: 100.0000 mg | ORAL_TABLET | Freq: Every day | ORAL | 1 refills | Status: DC

## 2023-05-23 NOTE — Progress Notes (Signed)
        Established patient visit  History, exam, impression, and plan:  1. Male-to-male transgender person Ethan Harris 37 year old transgender male presenting today for follow-up.  Has been doing well on estradiol 2 mg twice daily and spironolactone 100 mg daily.  Feels the medications are still working well and is happy with her results.  Recent labs showed stable results.  No concerns at this time.  Continue estradiol and spironolactone as prescribed.  Plan to check labs at next visit. - estradiol (ESTRACE) 2 MG tablet; Take 1 tablet (2 mg total) by mouth 2 (two) times daily.  Dispense: 180 tablet; Refill: 1 - spironolactone (ALDACTONE) 100 MG tablet; Take 1 tablet (100 mg total) by mouth daily.  Dispense: 180 tablet; Refill: 1  2. Generalized anxiety disorder Taking sertraline 100 mg daily, tolerating well without side effects.  Has been treated long-term with alprazolam 0.5 mg 3 times daily.  Admits that she takes these scheduled.  Most often uses at least 2 doses but some days requires 3 doses.  Is aware of benzodiazepine tolerance and dependence.  We have discussed recommendations for tapering off the medication to the least effective dose with the least effective frequency.  Challenged her to use this medication on an as-needed basis rather than scheduled and only for severe anxiety that is not responsive to other self soothing methods.  Patient verbalized understanding and is agreeable to the plan.  Refilling Xanax today. - ALPRAZolam (XANAX) 0.5 MG tablet; TAKE ONE TABLET BY MOUTH THREE TIMES A DAY AS NEEDED FOR ANXIETY  Dispense: 90 tablet; Refill: 1  3. Lipoma of torso Found a lump on her back in the middle to the right side of the spine.  The area is nontender without erythema or fluctuance.  On exam, palpable borders, slightly mobile.  Consistent with lipoma.  We reviewed that these are benign findings that do not require intervention.  Recommend continuing to monitor for  changes.  Procedures performed this visit: None.  Return for annual physical exam at your convenience.  __________________________________ Thayer Ohm, DNP, APRN, FNP-BC Primary Care and Sports Medicine [facility] [place]

## 2023-06-13 ENCOUNTER — Ambulatory Visit: Payer: Self-pay | Admitting: Medical-Surgical

## 2023-08-15 ENCOUNTER — Other Ambulatory Visit: Payer: Self-pay | Admitting: Medical-Surgical

## 2023-08-15 DIAGNOSIS — F411 Generalized anxiety disorder: Secondary | ICD-10-CM

## 2023-08-21 ENCOUNTER — Telehealth: Payer: Self-pay | Admitting: Medical-Surgical

## 2023-08-21 NOTE — Telephone Encounter (Signed)
Patient called stated refill on Xanax 0.5mg  was never received at the pharmacy Ethan [facility] [place]  Phone number is [phone]

## 2023-08-28 ENCOUNTER — Encounter: Payer: Self-pay | Admitting: Medical-Surgical

## 2023-10-14 ENCOUNTER — Other Ambulatory Visit: Payer: Self-pay

## 2023-10-14 DIAGNOSIS — F411 Generalized anxiety disorder: Secondary | ICD-10-CM

## 2023-10-14 MED ORDER — ALPRAZOLAM 0.5 MG PO TABS: ORAL_TABLET | ORAL | 0 refills | Status: DC

## 2023-10-14 NOTE — Telephone Encounter (Signed)
Last fill 08/16/23 last visit 05/23/23

## 2023-10-14 NOTE — Telephone Encounter (Signed)
Patient will be due for follow up in 1 month. Refill sent for 1 month to allow time to get on the schedule and complete the follow up appointment. Please contact her to facilitate scheduling. Appointment will need to be in person please.

## 2023-10-14 NOTE — Telephone Encounter (Signed)
Copied from CRM [id number]. Topic: Clinical - Medication Refill >> Oct 14, 2023 11:33 AM Almira Coaster wrote: Most Recent Primary Care Visit:  Provider: Christen Butter  Department: [facility]  Visit Type: OFFICE VISIT  Date: 05/23/2023  Medication: ALPRAZolam (XANAX) 0.5 MG tablet [[id number]]  Has the patient contacted their pharmacy? Yes, pharmacy sent a request for refill to the providers office.  (Agent: If no, request that the patient contact the pharmacy for the refill. If patient does not wish to contact the pharmacy document the reason why and proceed with request.) (Agent: If yes, when and what did the pharmacy advise?)  Is this the correct pharmacy for this prescription? Yes If no, delete pharmacy and type the correct one.  This is the patient's preferred pharmacy:  [facility] [id number] [facility], [place] - [address] [place] [address] Phone: [phone] Fax: [phone]    Has the prescription been filled recently? No  Is the patient out of the medication? Yes  Has the patient been seen for an appointment in the last year OR does the patient have an upcoming appointment? Yes  Can we respond through MyChart? Yes  Agent: Please be advised that Rx refills may take up to 3 business days. We ask that you follow-up with your pharmacy.

## 2023-10-15 ENCOUNTER — Other Ambulatory Visit: Payer: Self-pay | Admitting: Medical-Surgical

## 2023-10-15 DIAGNOSIS — F411 Generalized anxiety disorder: Secondary | ICD-10-CM

## 2023-10-16 NOTE — Telephone Encounter (Signed)
Patient advised.  She will call back to schedule.

## 2023-10-21 NOTE — Telephone Encounter (Signed)
Last filled 10/14/2023

## 2023-10-21 NOTE — Telephone Encounter (Signed)
Just ordered on 10/14/2023. Refill inappropriate at this time.

## 2023-12-03 ENCOUNTER — Telehealth: Payer: Self-pay | Admitting: Medical-Surgical

## 2023-12-03 DIAGNOSIS — F411 Generalized anxiety disorder: Secondary | ICD-10-CM

## 2023-12-03 MED ORDER — ALPRAZOLAM 0.5 MG PO TABS: ORAL_TABLET | ORAL | 0 refills | Status: DC

## 2023-12-03 NOTE — Telephone Encounter (Signed)
LM for pt to make sure she keeps her upcoming appt for further refills Roselyn Reef, CMA

## 2023-12-03 NOTE — Telephone Encounter (Signed)
Refill sent. Must attend upcoming appointment for further refills.  ___________________________________________ Thayer Ohm, DNP, APRN, FNP-BC Primary Care and Sports Medicine [facility] [place]

## 2023-12-03 NOTE — Telephone Encounter (Signed)
Please see note below.  Copied from CRM [id number]. Topic: Clinical - Prescription Issue >> Dec 03, 2023 10:51 AM Alvino Blood C wrote: Reason for CRM: Patient will run out of the following medication: ALPRAZolam Prudy Feeler) 0.5 MG tablet before the scheduled appointment on 12/12/2023 and would like to know could something be called into the pharmacy before then.

## 2023-12-03 NOTE — Addendum Note (Signed)
Addended byChristen Butter on: 12/03/2023 01:10 PM   Modules accepted: Orders

## 2023-12-12 ENCOUNTER — Encounter: Payer: Self-pay | Admitting: Medical-Surgical

## 2023-12-12 ENCOUNTER — Ambulatory Visit (INDEPENDENT_AMBULATORY_CARE_PROVIDER_SITE_OTHER): Payer: Self-pay | Admitting: Medical-Surgical

## 2023-12-12 VITALS — BP 129/73 | HR 88 | Resp 20 | Ht 66.0 in | Wt 178.1 lb

## 2023-12-12 DIAGNOSIS — Z789 Other specified health status: Secondary | ICD-10-CM

## 2023-12-12 DIAGNOSIS — F411 Generalized anxiety disorder: Secondary | ICD-10-CM

## 2023-12-12 DIAGNOSIS — E785 Hyperlipidemia, unspecified: Secondary | ICD-10-CM

## 2023-12-12 MED ORDER — SPIRONOLACTONE 100 MG PO TABS: 100.0000 mg | ORAL_TABLET | Freq: Every day | ORAL | 1 refills | Status: DC

## 2023-12-12 MED ORDER — ESTRADIOL 2 MG PO TABS: 2.0000 mg | ORAL_TABLET | Freq: Two times a day (BID) | ORAL | 1 refills | Status: DC

## 2023-12-12 NOTE — Progress Notes (Signed)
        Established patient visit  History, exam, impression, and plan:  1. Male-to-male transgender person (Primary) Very pleasant 38 year old transgender male presenting today for follow-up on hormone replacement therapy.  Has been using estradiol 2 mg twice daily spironolactone 100 mg daily.  Tolerates both medications well without side effects.  Happy with the results to date.  No history of blood clots or current concerns/symptoms that would indicate the need for further evaluation.  Due for labs ordering today.  Sending refills at current doses. - Estradiol - Testosterone - CBC - CMP14+EGFR - Lipid panel - estradiol (ESTRACE) 2 MG tablet; Take 1 tablet (2 mg total) by mouth 2 (two) times daily.  Dispense: 180 tablet; Refill: 1 - spironolactone (ALDACTONE) 100 MG tablet; Take 1 tablet (100 mg total) by mouth daily.  Dispense: 180 tablet; Refill: 1  2. Generalized anxiety disorder Known history of general anxiety disorder and depression.  Was previously treated with Zoloft 100 mg daily as well as Xanax 3 times daily as needed.  Reports stopping the Zoloft on her own 1 to 2 months ago as she felt it was not helping any and thought her depression was well-controlled.  Since then, she reports that she has been doing well overall and has not had any rebound issues with depression.  Even notes that her anxiety is better without the Zoloft and she is now only using 1 to 1-1/2 tablets of Xanax per day on an as-needed basis.  Plan for discontinuation of Zoloft but advised that if she needs to reevaluate in the future, we can certainly reconsider.  Continue Xanax as prescribed and continue working on reducing daily amount used.  3. Dyslipidemia History of dyslipidemia but not currently on any kind of medications for treatment.  Checking labs today. - CMP14+EGFR - Lipid panel   Procedures performed this visit: None.  Return in about 6 months (around 06/10/2024) for Transgender care/mood  follow-up.  __________________________________ Thayer Ohm, DNP, APRN, FNP-BC Primary Care and Sports Medicine [facility] [place]

## 2023-12-13 ENCOUNTER — Encounter: Payer: Self-pay | Admitting: Medical-Surgical

## 2023-12-13 LAB — CMP14+EGFR
ALT: 18 [IU]/L (ref 0–44)
AST: 18 [IU]/L (ref 0–40)
Albumin: 4.4 g/dL (ref 4.1–5.1)
Alkaline Phosphatase: 51 [IU]/L (ref 44–121)
BUN/Creatinine Ratio: 18 (ref 9–20)
BUN: 11 mg/dL (ref 6–20)
Bilirubin Total: 0.6 mg/dL (ref 0.0–1.2)
CO2: 23 mmol/L (ref 20–29)
Calcium: 9.1 mg/dL (ref 8.7–10.2)
Chloride: 102 mmol/L (ref 96–106)
Creatinine, Ser: 0.61 mg/dL — ABNORMAL LOW (ref 0.76–1.27)
Globulin, Total: 2.4 g/dL (ref 1.5–4.5)
Glucose: 103 mg/dL — ABNORMAL HIGH (ref 70–99)
Potassium: 4.6 mmol/L (ref 3.5–5.2)
Sodium: 139 mmol/L (ref 134–144)
Total Protein: 6.8 g/dL (ref 6.0–8.5)
eGFR: 126 mL/min/{1.73_m2} (ref >59)

## 2023-12-13 LAB — LIPID PANEL
Chol/HDL Ratio: 5.1 {ratio} — ABNORMAL HIGH (ref 0.0–5.0)
Cholesterol, Total: 224 mg/dL — ABNORMAL HIGH (ref 100–199)
HDL: 44 mg/dL (ref >39)
LDL Chol Calc (NIH): 128 mg/dL — ABNORMAL HIGH (ref 0–99)
Triglycerides: 293 mg/dL — ABNORMAL HIGH (ref 0–149)
VLDL Cholesterol Cal: 52 mg/dL — ABNORMAL HIGH (ref 5–40)

## 2023-12-13 LAB — CBC
Hematocrit: 40.8 % (ref 37.5–51.0)
Hemoglobin: 13.6 g/dL (ref 13.0–17.7)
MCH: 28.5 pg (ref 26.6–33.0)
MCHC: 33.3 g/dL (ref 31.5–35.7)
MCV: 85 fL (ref 79–97)
Platelets: 271 10*3/uL (ref 150–450)
RBC: 4.78 x10E6/uL (ref 4.14–5.80)
RDW: 13.1 % (ref 11.6–15.4)
WBC: 6.8 10*3/uL (ref 3.4–10.8)

## 2023-12-13 LAB — TESTOSTERONE: Testosterone: 507 ng/dL (ref 264–916)

## 2023-12-13 LAB — ESTRADIOL: Estradiol: 68 pg/mL — ABNORMAL HIGH (ref 7.6–42.6)

## 2024-01-15 ENCOUNTER — Other Ambulatory Visit: Payer: Self-pay | Admitting: Medical-Surgical

## 2024-01-15 DIAGNOSIS — F411 Generalized anxiety disorder: Secondary | ICD-10-CM

## 2024-01-15 NOTE — Telephone Encounter (Signed)
 Last office visit 12/12/2023  Last filled 12/03/2023  Future appointment 06/11/2024

## 2024-05-11 ENCOUNTER — Encounter: Payer: Self-pay | Admitting: Medical-Surgical

## 2024-06-11 ENCOUNTER — Ambulatory Visit: Payer: Self-pay | Admitting: Medical-Surgical

## 2024-06-11 ENCOUNTER — Encounter: Payer: Self-pay | Admitting: Medical-Surgical

## 2024-06-11 VITALS — BP 104/62 | HR 75 | Resp 20 | Ht 66.0 in | Wt 172.1 lb

## 2024-06-11 DIAGNOSIS — F411 Generalized anxiety disorder: Secondary | ICD-10-CM

## 2024-06-11 DIAGNOSIS — D171 Benign lipomatous neoplasm of skin and subcutaneous tissue of trunk: Secondary | ICD-10-CM | POA: Diagnosis not present

## 2024-06-11 DIAGNOSIS — Z789 Other specified health status: Secondary | ICD-10-CM

## 2024-06-11 MED ORDER — ESTRADIOL 2 MG PO TABS: 2.0000 mg | ORAL_TABLET | Freq: Two times a day (BID) | ORAL | 1 refills | Status: DC

## 2024-06-11 MED ORDER — SPIRONOLACTONE 100 MG PO TABS: 100.0000 mg | ORAL_TABLET | Freq: Every day | ORAL | 1 refills | Status: DC

## 2024-06-11 NOTE — Progress Notes (Signed)
        Established patient visit  History, exam, impression, and plan:  1. Male-to-male transgender person (Primary) Pleasant 38 year old transgender male presenting for follow up on GHAT. Taking estradiol  4mg  twice daily and spironolactone  100mg  daily. Tolerating both medications well w/o SE or intolerances. Her last estradiol  level was low with elevated testosterone  but it was related to missing medication doses. Currently happy with her results and regimen. Plan to recheck labs today. No s/s of blood clots and patient aware to monitor closely with exogenous hormone use. Continue estradiol  and spironolactone  as prescribed.  - Estradiol  - Testosterone  - estradiol  (ESTRACE ) 2 MG tablet; Take 1 tablet (2 mg total) by mouth 2 (two) times daily.  Dispense: 180 tablet; Refill: 1 - spironolactone  (ALDACTONE ) 100 MG tablet; Take 1 tablet (100 mg total) by mouth daily.  Dispense: 180 tablet; Refill: 1  2. Generalized anxiety disorder Long history of generalized anxiety d/o. Uses Xanax  0.5mg  TID prn. Has been able to reduce her dosing over time and now uses it only for breakthrough anxiety that is not responsive to other self soothing measures. Admits that her roommate has tremendous mental health concerns that are often difficult to handle which cause her to lean on the Xanax  more frequently.   3. Lipoma of torso Hx of a lipoma on the right side of the thoracic spine just above the bra line. Requesting it be evaluated for growth. On evaluation, the lipoma is still there with clear borders approximately the size of a quarter. No growth. No intervention needed.    Procedures performed this visit: None.  Return in about 6 months (around 12/12/2024) for GHAT follow up.  __________________________________ Zada FREDRIK Palin, DNP, APRN, FNP-BC Primary Care and Sports Medicine [facility] [place]

## 2024-06-12 ENCOUNTER — Ambulatory Visit: Payer: Self-pay | Admitting: Medical-Surgical

## 2024-06-12 LAB — ESTRADIOL: Estradiol: 68.6 pg/mL — ABNORMAL HIGH (ref 7.6–42.6)

## 2024-06-12 LAB — TESTOSTERONE: Testosterone: 365 ng/dL (ref 264–916)

## 2024-06-29 ENCOUNTER — Encounter: Payer: Self-pay | Admitting: Medical-Surgical

## 2024-07-28 ENCOUNTER — Other Ambulatory Visit: Payer: Self-pay | Admitting: Medical-Surgical

## 2024-07-28 DIAGNOSIS — F411 Generalized anxiety disorder: Secondary | ICD-10-CM

## 2024-07-29 NOTE — Telephone Encounter (Signed)
 Last filled 01/15/2024  Last OV 06/11/2024  Upcoming appointment 12/14/2024

## 2024-08-24 ENCOUNTER — Encounter: Payer: Self-pay | Admitting: Medical-Surgical

## 2024-09-22 ENCOUNTER — Other Ambulatory Visit: Payer: Self-pay | Admitting: Medical-Surgical

## 2024-09-22 MED ORDER — ALBUTEROL SULFATE HFA 108 (90 BASE) MCG/ACT IN AERS: 1.0000 | INHALATION_SPRAY | Freq: Four times a day (QID) | RESPIRATORY_TRACT | 2 refills | Status: AC | PRN

## 2024-09-22 NOTE — Telephone Encounter (Signed)
 Patient requesting rx rf of rescue inhaler.  Not showing in patient current med list  Pended from past med list for refill  Last refilled 12/07/2019 Last OV 06/11/2024 Upcoming appt 12/14/2024

## 2024-09-22 NOTE — Addendum Note (Signed)
 Addended by: Maddock Finigan P on: 09/22/2024 05:02 PM   Modules accepted: Orders

## 2024-09-22 NOTE — Telephone Encounter (Signed)
 Copied from CRM [id number]. Topic: Clinical - Prescription Issue >> Sep 22, 2024  1:49 PM Cleave MATSU wrote: Reason for CRM: pt stated that she is trying to get her inhaler refilled and pharmacy stated they couldn't find a record of her ever filling it there but she said she has. So can dr. Willo send in prescription to [facility]

## 2024-09-24 ENCOUNTER — Emergency Department (HOSPITAL_BASED_OUTPATIENT_CLINIC_OR_DEPARTMENT_OTHER)

## 2024-09-24 ENCOUNTER — Encounter (HOSPITAL_BASED_OUTPATIENT_CLINIC_OR_DEPARTMENT_OTHER): Payer: Self-pay | Admitting: Emergency Medicine

## 2024-09-24 ENCOUNTER — Emergency Department (HOSPITAL_BASED_OUTPATIENT_CLINIC_OR_DEPARTMENT_OTHER): Admission: EM | Admit: 2024-09-24 | Discharge: 2024-09-24 | Disposition: A | Discharge status: Home / Self Care

## 2024-09-24 ENCOUNTER — Other Ambulatory Visit: Payer: Self-pay

## 2024-09-24 DIAGNOSIS — R0789 Other chest pain: Secondary | ICD-10-CM | POA: Diagnosis present

## 2024-09-24 DIAGNOSIS — J45909 Unspecified asthma, uncomplicated: Secondary | ICD-10-CM | POA: Insufficient documentation

## 2024-09-24 LAB — BASIC METABOLIC PANEL WITH GFR
Anion gap: 12 (ref 5–15)
BUN: 13 mg/dL (ref 6–20)
CO2: 22 mmol/L (ref 22–32)
Calcium: 9.4 mg/dL (ref 8.9–10.3)
Chloride: 103 mmol/L (ref 98–111)
Creatinine, Ser: 0.63 mg/dL (ref 0.61–1.24)
GFR, Estimated: >60 mL/min (ref >60)
Glucose, Bld: 94 mg/dL (ref 70–99)
Potassium: 4.3 mmol/L (ref 3.5–5.1)
Sodium: 137 mmol/L (ref 135–145)

## 2024-09-24 LAB — CBC
HCT: 41.8 % (ref 39.0–52.0)
Hemoglobin: 14.3 g/dL (ref 13.0–17.0)
MCH: 28.7 pg (ref 26.0–34.0)
MCHC: 34.2 g/dL (ref 30.0–36.0)
MCV: 83.8 fL (ref 80.0–100.0)
Platelets: 276 K/uL (ref 150–400)
RBC: 4.99 MIL/uL (ref 4.22–5.81)
RDW: 12.7 % (ref 11.5–15.5)
WBC: 7.5 K/uL (ref 4.0–10.5)
nRBC: 0 % (ref 0.0–0.2)

## 2024-09-24 LAB — TROPONIN T, HIGH SENSITIVITY: Troponin T High Sensitivity: <15 ng/L (ref 0–19)

## 2024-09-24 LAB — D-DIMER, QUANTITATIVE: D-Dimer, Quant: <0.27 ug{FEU}/mL (ref 0.00–0.50)

## 2024-09-24 NOTE — Discharge Instructions (Signed)
 Your workup today was very reassuring, no evidence of heart attack, blood clot or other emergent pathology.  Please follow-up with your primary care doctor, if you have significant worsening chest pain, shortness of breath please return for further evaluation.

## 2024-09-24 NOTE — ED Triage Notes (Signed)
 Cp x 3 days  intermittent mild sob no N/V  occ left arm pain x 3 days  radiates around

## 2024-09-24 NOTE — ED Notes (Signed)
Lab notified of D-dimer add on.

## 2024-09-24 NOTE — ED Provider Notes (Signed)
 [facility] Provider Note   CSN: [id number] Arrival date & time: 09/24/24  1115     Patient presents with: Chest Pain   Ethan Harris is a 38 y.o. adult with past medical history significant for asthma, anxiety, hyperlipidemia who presents with concern for intermittent mild shortness of breath, mild chest pain, arm pain for around 3 days.  Patient reports that the pain moves around.  She is taking hormone therapy, male to male transgender identifying person.  Denies any previous history of blood clots.  Denies any leg swelling, recent travel, history of cancer, recent surgery.  Reports the pain is very mild, 1-3 out of 10 at most.  Reports that she was having some wheezing last night and used her inhaler which helped.  Denies feeling short of breath at this time.    Chest Pain      Prior to Admission medications   Medication Sig Start Date End Date Taking? Authorizing Provider  albuterol  (VENTOLIN  HFA) 108 (90 Base) MCG/ACT inhaler Inhale 1-2 puffs into the lungs every 6 (six) hours as needed for wheezing or shortness of breath. 09/22/24   Willo Mini, NP  ALPRAZolam  (XANAX ) 0.5 MG tablet TAKE 1 TABLET BY MOUTH 3 TIMES A DAY AS NEEDED FOR ANXIETY 07/29/24   Willo Mini, NP  betamethasone  dipropionate 0.05 % cream APPLY TO AFFECTED AREA(S) TWO TIMES A DAY AS NEEDED FOR ECZEMA 07/29/24   Willo Mini, NP  estradiol  (ESTRACE ) 2 MG tablet Take 1 tablet (2 mg total) by mouth 2 (two) times daily. 06/11/24   Jessup, Joy, NP  loratadine (CLARITIN) 10 MG tablet Take 10 mg by mouth daily.    [provider]  MELATONIN PO Take 1-1.5 mg by mouth.    [provider]  Multiple Vitamins-Minerals (EQ MULTIVITAMINS ADULT GUMMY) CHEW Chew by mouth.    [provider]  NON FORMULARY LUTIN    [provider]  spironolactone  (ALDACTONE ) 100 MG tablet Take 1 tablet (100 mg total) by mouth daily. 06/11/24   Willo Mini, NP     Allergies: Kiwi extract and Other    Review of Systems  Cardiovascular:  Positive for chest pain.  All other systems reviewed and are negative.   Updated Vital Signs BP 112/71   Pulse (!) 59   Temp 98.1 F (36.7 C) (Oral)   Resp 16   SpO2 96%   Physical Exam Vitals and nursing note reviewed.  Constitutional:      General: She is not in acute distress.    Appearance: Normal appearance.  HENT:     Head: Normocephalic and atraumatic.  Eyes:     General:        Right eye: No discharge.        Left eye: No discharge.  Cardiovascular:     Rate and Rhythm: Normal rate and regular rhythm.     Heart sounds: No murmur heard.    No friction rub. No gallop.  Pulmonary:     Effort: Pulmonary effort is normal.     Breath sounds: Normal breath sounds.     Comments: No wheezing, rhonchi, stridor, rales Abdominal:     General: Bowel sounds are normal.     Palpations: Abdomen is soft.  Skin:    General: Skin is warm and dry.     Capillary Refill: Capillary refill takes less than 2 seconds.  Neurological:     Mental Status: She is alert and oriented to  person, place, and time.  Psychiatric:        Mood and Affect: Mood normal.        Behavior: Behavior normal.     (all labs ordered are listed, but only abnormal results are displayed) Labs Reviewed  BASIC METABOLIC PANEL WITH GFR  CBC  D-DIMER, QUANTITATIVE  TROPONIN T, HIGH SENSITIVITY    EKG: EKG Interpretation Date/Time:  Thursday September 24 2024 11:21:54 EST Ventricular Rate:  80 PR Interval:  146 QRS Duration:  84 QT Interval:  348 QTC Calculation: 402 R Axis:   67  Text Interpretation: Sinus rhythm Compared with prior EKG from 07/13/2016 Confirmed by Gennaro Bouchard ([id number]) on 09/24/2024 11:25:28 AM  Radiology: DG Chest 2 View Result Date: 09/24/2024 EXAM: 2 VIEW(S) XRAY OF THE CHEST 09/24/2024 12:57:26 PM COMPARISON: Comparison is 07/11/2016. CLINICAL HISTORY: cp FINDINGS: LINES, TUBES AND DEVICES:  Multiple wires and leads project over the chest on the frontal radiograph. LUNGS AND PLEURA: No focal pulmonary opacity. No pleural effusion. No pneumothorax. HEART AND MEDIASTINUM: No acute abnormality of the cardiac and mediastinal silhouettes. BONES AND SOFT TISSUES: No acute osseous abnormality. IMPRESSION: 1. No acute cardiopulmonary process detected. Electronically signed by: Rockey Kilts MD 09/24/2024 01:35 PM EST RP Workstation: [id number]     Procedures   Medications Ordered in the ED - No data to display                                  Medical Decision Making Amount and/or Complexity of Data Reviewed Labs: ordered. Radiology: ordered.   This patient is a 38 y.o. adult  who presents to the ED for concern of chest pain, shob.   Differential diagnoses prior to evaluation: The emergent differential diagnosis includes, but is not limited to,  ACS, AAS, PE, Mallory-Weiss, Boerhaave's, Pneumonia, acute bronchitis, asthma or COPD exacerbation, anxiety, MSK pain or traumatic injury to the chest, acid reflux versus other . This is not an exhaustive differential.   Past Medical History / Co-morbidities / Social History: Estrogen replacement therapy, hyperlipidemia, patient endorses vape use but recently quit.  Physical Exam: Physical exam performed. The pertinent findings include: Vital signs stable in emergency department, no wheezing, rhonchi, stridor, rales throughout  Lab Tests/Imaging studies: I personally interpreted labs/imaging and the pertinent results include: CBC unremarkable, BMP unremarkable, negative troponin x 1 in context of atypical chest pain ongoing for several days, normal D-dimer.  Chest x-ray shows no evidence of acute intrathoracic abnormality.. I agree with the radiologist interpretation.  Cardiac monitoring: EKG obtained and interpreted by myself and attending physician which shows: EKG without acute ST-T changes.   Disposition: After consideration of the  diagnostic results and the patients response to treatment, I feel that no evidence of acute cardiac or pulmonary etiology to explain patient's symptoms, patient discharged in stable condition, return precautions given.   emergency department workup does not suggest an emergent condition requiring admission or immediate intervention beyond what has been performed at this time. The plan is: as above. The patient is safe for discharge and has been instructed to return immediately for worsening symptoms, change in symptoms or any other concerns.   Final diagnoses:  Atypical chest pain    ED Discharge Orders     None          Rosan Sherlean DEL, PA-C 09/24/24 1341    Kammerer, Bouchard CROME, DO 09/25/24 [phone]

## 2024-09-24 NOTE — ED Notes (Signed)
 Patient transported to X-ray

## 2024-12-14 ENCOUNTER — Encounter: Payer: Self-pay | Admitting: Medical-Surgical

## 2024-12-14 ENCOUNTER — Telehealth: Admitting: Medical-Surgical

## 2024-12-14 VITALS — Ht 66.0 in | Wt 184.0 lb

## 2024-12-14 DIAGNOSIS — J309 Allergic rhinitis, unspecified: Secondary | ICD-10-CM | POA: Diagnosis not present

## 2024-12-14 DIAGNOSIS — J4520 Mild intermittent asthma, uncomplicated: Secondary | ICD-10-CM | POA: Diagnosis not present

## 2024-12-14 DIAGNOSIS — F411 Generalized anxiety disorder: Secondary | ICD-10-CM | POA: Diagnosis not present

## 2024-12-14 DIAGNOSIS — D171 Benign lipomatous neoplasm of skin and subcutaneous tissue of trunk: Secondary | ICD-10-CM | POA: Diagnosis not present

## 2024-12-14 DIAGNOSIS — Z789 Other specified health status: Secondary | ICD-10-CM

## 2024-12-14 MED ORDER — ALPRAZOLAM 0.5 MG PO TABS: ORAL_TABLET | ORAL | 2 refills | Status: AC

## 2024-12-14 MED ORDER — SPIRONOLACTONE 100 MG PO TABS: 100.0000 mg | ORAL_TABLET | Freq: Every day | ORAL | 1 refills | Status: AC

## 2024-12-14 MED ORDER — ESTRADIOL 2 MG PO TABS: 2.0000 mg | ORAL_TABLET | Freq: Two times a day (BID) | ORAL | 1 refills | Status: AC

## 2024-12-14 NOTE — Progress Notes (Signed)
 Virtual Visit via Video Note  I connected with Ethan Harris on 12/14/24 at  2:00 PM EST by a video enabled telemedicine application and verified that I am speaking with the correct person using two identifiers.   I discussed the limitations of evaluation and management by telemedicine and the availability of in person appointments. The patient expressed understanding and agreed to proceed.  Patient location: home Provider locations: office  Subjective:    Discussed the use of AI scribe software for clinical note transcription with the patient, who gave verbal consent to proceed.  History of Present Illness   Ethan Harris is a 39 year old male who presents for a routine follow-up.  Allergic rhinitis and eczema - Uses Claritin as needed for allergic rhinitis without current complications - Applies betamethasone  cream for intermittent eczema flare-ups without current complications  Asthma - Mild asthma - Uses albuterol  inhaler occasionally, with increased use during respiratory infection season - No current respiratory complaints  Anxiety symptoms - Takes Xanax , currently 1 to 1.5 tablets daily as needed - Previously took three tablets a day as a scheduled medication  Hormone therapy - On spironolactone  100 mg daily - On estradiol  2 mg twice daily, but takes both doses together in the morning  Dermatologic lesions - Evaluated by dermatologist for a lipoma on her back and a knot in her lip - Seeking a dermatologist within the [facility] for further care     Past medical history, Surgical history, Family history not pertinant except as noted below, Social history, Allergies, and medications have been entered into the medical record, reviewed, and corrections made.   Review of Systems: See HPI for pertinent positives and negatives.   Objective:    General: Speaking clearly in complete sentences without any shortness of breath.  Alert and oriented x3.  Normal  judgment. No apparent acute distress.  Impression and Recommendations:    Assessment and Plan    Generalized anxiety disorder Managed with Xanax , reduced from three tablets three times a day to one to one and a half tablets per day, indicating improved control. - Continue Xanax  as currently prescribed, only use as needed.  Allergic rhinitis - Continue Claritin 10mg  daily as currently prescribed.  Mild persistent asthma Managed with albuterol  inhaler, used occasionally, especially during allergy season. - Continue albuterol  inhaler as needed.  Eczema Managed with betamethasone  cream, applied as needed. - Continue betamethasone  cream as needed.  Gender-affirming hormone therapy Managed with estradiol  2 mg twice daily, but prefers 4mg  taken in the morning for ease of dosing. Also on Spironolactone , well tolerated. Estradiol  levels lower than normal but asymptomatic and satisfied with results. - Continue estradiol  as currently prescribed. - Continue spironolactone  100mg  daily as currently prescribed.  Lipoma of trunk Lipoma on the back evaluated by a dermatologist. Previous consultation unsatisfactory, seeking provider within insurance network. - Referred to a male dermatologist within the insurance network for evaluation of lipoma.  General health maintenance Lab work last done in July, estradiol  levels lower than normal but stable. Satisfied with current health status. - Will recheck labs, including blood counts and cholesterol, at the next visit.     I discussed the assessment and treatment plan with the patient. The patient was provided an opportunity to ask questions and all were answered. The patient agreed with the plan and demonstrated an understanding of the instructions.   The patient was advised to call back or seek an in-person evaluation if the symptoms worsen or  if the condition fails to improve as anticipated.  Return in about 6 months (around 06/13/2025) for GAHT  follow up w/labs.  Ethan FREDRIK Palin, DNP, APRN, FNP-BC [facility]
